# Patient Record
Sex: Male | Born: 1965 | Race: White | Hispanic: No | Marital: Married | Smoking: Former smoker
Health system: Southern US, Community
[De-identification: ages and names within clinical notes are randomized; demographics above are authoritative.]

## PROBLEM LIST (undated history)

## (undated) DIAGNOSIS — E782 Mixed hyperlipidemia: Secondary | ICD-10-CM

## (undated) DIAGNOSIS — I10 Essential (primary) hypertension: Secondary | ICD-10-CM

## (undated) DIAGNOSIS — I2699 Other pulmonary embolism without acute cor pulmonale: Secondary | ICD-10-CM

## (undated) DIAGNOSIS — E119 Type 2 diabetes mellitus without complications: Secondary | ICD-10-CM

## (undated) DIAGNOSIS — G7111 Myotonic muscular dystrophy: Secondary | ICD-10-CM

## (undated) HISTORY — PX: DENTAL SURGERY: SHX609

## (undated) HISTORY — PX: APPENDECTOMY: SHX54

## (undated) HISTORY — PX: OTHER SURGICAL HISTORY: SHX169

## (undated) HISTORY — PX: JOINT REPLACEMENT: SHX530

## (undated) HISTORY — PX: EYE SURGERY: SHX253

---

## 2005-11-22 ENCOUNTER — Emergency Department: Payer: Self-pay | Admitting: Emergency Medicine

## 2007-11-10 ENCOUNTER — Emergency Department: Payer: Self-pay | Admitting: Emergency Medicine

## 2009-09-08 ENCOUNTER — Emergency Department: Payer: Self-pay | Admitting: Emergency Medicine

## 2009-12-03 ENCOUNTER — Emergency Department: Payer: Self-pay | Admitting: Unknown Physician Specialty

## 2011-09-05 ENCOUNTER — Encounter: Payer: Self-pay | Admitting: Family Medicine

## 2011-09-17 ENCOUNTER — Encounter: Payer: Self-pay | Admitting: Family Medicine

## 2011-10-18 ENCOUNTER — Encounter: Payer: Self-pay | Admitting: Family Medicine

## 2012-01-21 ENCOUNTER — Emergency Department: Payer: Self-pay | Admitting: Unknown Physician Specialty

## 2012-01-21 LAB — CBC
HCT: 40.1 % (ref 40.0–52.0)
HGB: 13.1 g/dL (ref 13.0–18.0)
MCH: 29.5 pg (ref 26.0–34.0)
MCHC: 32.7 g/dL (ref 32.0–36.0)
RBC: 4.45 10*6/uL (ref 4.40–5.90)
WBC: 8.4 10*3/uL (ref 3.8–10.6)

## 2012-01-21 LAB — BASIC METABOLIC PANEL
Anion Gap: 8 (ref 7–16)
BUN: 23 mg/dL — ABNORMAL HIGH (ref 7–18)
Chloride: 106 mmol/L (ref 98–107)
Co2: 27 mmol/L (ref 21–32)
Creatinine: 0.89 mg/dL (ref 0.60–1.30)
EGFR (African American): >60
EGFR (Non-African Amer.): >60
Sodium: 141 mmol/L (ref 136–145)

## 2013-04-08 ENCOUNTER — Encounter (INDEPENDENT_AMBULATORY_CARE_PROVIDER_SITE_OTHER): Payer: Self-pay | Admitting: Ophthalmology

## 2013-04-08 DIAGNOSIS — E1139 Type 2 diabetes mellitus with other diabetic ophthalmic complication: Secondary | ICD-10-CM

## 2013-04-08 DIAGNOSIS — H3581 Retinal edema: Secondary | ICD-10-CM

## 2013-04-08 DIAGNOSIS — E11359 Type 2 diabetes mellitus with proliferative diabetic retinopathy without macular edema: Secondary | ICD-10-CM

## 2013-04-08 DIAGNOSIS — H2510 Age-related nuclear cataract, unspecified eye: Secondary | ICD-10-CM

## 2013-04-08 DIAGNOSIS — H43819 Vitreous degeneration, unspecified eye: Secondary | ICD-10-CM

## 2013-04-17 ENCOUNTER — Ambulatory Visit (INDEPENDENT_AMBULATORY_CARE_PROVIDER_SITE_OTHER): Payer: Self-pay | Admitting: Ophthalmology

## 2013-04-17 DIAGNOSIS — E11359 Type 2 diabetes mellitus with proliferative diabetic retinopathy without macular edema: Secondary | ICD-10-CM

## 2013-04-17 DIAGNOSIS — E1165 Type 2 diabetes mellitus with hyperglycemia: Secondary | ICD-10-CM

## 2013-05-06 ENCOUNTER — Other Ambulatory Visit (INDEPENDENT_AMBULATORY_CARE_PROVIDER_SITE_OTHER): Payer: Self-pay | Admitting: Ophthalmology

## 2013-05-06 DIAGNOSIS — E1139 Type 2 diabetes mellitus with other diabetic ophthalmic complication: Secondary | ICD-10-CM

## 2013-05-06 DIAGNOSIS — E11359 Type 2 diabetes mellitus with proliferative diabetic retinopathy without macular edema: Secondary | ICD-10-CM

## 2013-09-30 ENCOUNTER — Ambulatory Visit (INDEPENDENT_AMBULATORY_CARE_PROVIDER_SITE_OTHER): Payer: Self-pay | Admitting: Ophthalmology

## 2014-09-01 ENCOUNTER — Encounter: Payer: Self-pay | Admitting: Psychiatry

## 2014-09-16 ENCOUNTER — Encounter: Payer: Self-pay | Admitting: Psychiatry

## 2014-10-17 ENCOUNTER — Encounter: Payer: Self-pay | Admitting: Psychiatry

## 2014-11-16 ENCOUNTER — Encounter: Payer: Self-pay | Admitting: Psychiatry

## 2015-05-19 ENCOUNTER — Emergency Department: Payer: Medicare Other

## 2015-05-19 ENCOUNTER — Encounter: Payer: Self-pay | Admitting: Emergency Medicine

## 2015-05-19 ENCOUNTER — Emergency Department
Admission: EM | Admit: 2015-05-19 | Discharge: 2015-05-19 | Disposition: A | Discharge status: Home / Self Care | Payer: Medicare Other | Attending: Emergency Medicine | Admitting: Emergency Medicine

## 2015-05-19 DIAGNOSIS — Z87891 Personal history of nicotine dependence: Secondary | ICD-10-CM | POA: Diagnosis not present

## 2015-05-19 DIAGNOSIS — S93402A Sprain of unspecified ligament of left ankle, initial encounter: Secondary | ICD-10-CM | POA: Diagnosis not present

## 2015-05-19 DIAGNOSIS — Y998 Other external cause status: Secondary | ICD-10-CM | POA: Diagnosis not present

## 2015-05-19 DIAGNOSIS — E119 Type 2 diabetes mellitus without complications: Secondary | ICD-10-CM | POA: Diagnosis not present

## 2015-05-19 DIAGNOSIS — Y9389 Activity, other specified: Secondary | ICD-10-CM | POA: Insufficient documentation

## 2015-05-19 DIAGNOSIS — Y9289 Other specified places as the place of occurrence of the external cause: Secondary | ICD-10-CM | POA: Diagnosis not present

## 2015-05-19 DIAGNOSIS — W1839XA Other fall on same level, initial encounter: Secondary | ICD-10-CM | POA: Diagnosis not present

## 2015-05-19 DIAGNOSIS — S99912A Unspecified injury of left ankle, initial encounter: Secondary | ICD-10-CM | POA: Diagnosis present

## 2015-05-19 DIAGNOSIS — M7662 Achilles tendinitis, left leg: Secondary | ICD-10-CM | POA: Diagnosis not present

## 2015-05-19 HISTORY — DX: Type 2 diabetes mellitus without complications: E11.9

## 2015-05-19 HISTORY — DX: Myotonic muscular dystrophy: G71.11

## 2015-05-19 NOTE — ED Notes (Signed)
Pt has been walking more than usual in the past few days, now having left ankle pain-pt thinks possible tendonitis.

## 2015-05-19 NOTE — Discharge Instructions (Signed)
Achilles Tendinitis Achilles tendinitis is inflammation of the tough, cord-like band that attaches the lower muscles of your leg to your heel (Achilles tendon). It is usually caused by overusing the tendon and joint involved.  CAUSES Achilles tendinitis can happen because of:  A sudden increase in exercise or activity (such as running).  Doing the same exercises or activities (such as jumping) over and over.  Not warming up calf muscles before exercising.  Exercising in shoes that are worn out or not made for exercise.  Having arthritis or a bone growth on the back of the heel bone. This can rub against the tendon and hurt the tendon. SIGNS AND SYMPTOMS The most common symptoms are:  Pain in the back of the leg, just above the heel. The pain usually gets worse with exercise and better with rest.  Stiffness or soreness in the back of the leg, especially in the morning.  Swelling of the skin over the Achilles tendon.  Trouble standing on tiptoe. Sometimes, an Achilles tendon tears (ruptures). Symptoms of an Achilles tendon rupture can include:  Sudden, severe pain in the back of the leg.  Trouble putting weight on the foot or walking normally. DIAGNOSIS Achilles tendinitis will be diagnosed based on symptoms and a physical examination. An X-ray may be done to check if another condition is causing your symptoms. An MRI may be ordered if your health care provider suspects you may have completely torn your tendon, which is called an Achilles tendon rupture.  TREATMENT  Achilles tendinitis usually gets better over time. It can take weeks to months to heal completely. Treatment focuses on treating the symptoms and helping the injury heal. HOME CARE INSTRUCTIONS   Rest your Achilles tendon and avoid activities that cause pain.  Apply ice to the injured area:  Put ice in a plastic bag.  Place a towel between your skin and the bag.  Leave the ice on for 20 minutes, 2-3 times a  day  Try to avoid using the tendon (other than gentle range of motion) while the tendon is painful. Do not resume use until instructed by your health care provider. Then begin use gradually. Do not increase use to the point of pain. If pain does develop, decrease use and continue the above measures. Gradually increase activities that do not cause discomfort until you achieve normal use.  Do exercises to make your calf muscles stronger and more flexible. Your health care provider or physical therapist can recommend exercises for you to do.  Wrap your ankle with an elastic bandage or other wrap. This can help keep your tendon from moving too much. Your health care provider will show you how to wrap your ankle correctly.  Only take over-the-counter or prescription medicines for pain, discomfort, or fever as directed by your health care provider. SEEK MEDICAL CARE IF:   Your pain and swelling increase or pain is uncontrolled with medicines.  You develop new, unexplained symptoms or your symptoms get worse.  You are unable to move your toes or foot.  You develop warmth and swelling in your foot.  You have an unexplained temperature. MAKE SURE YOU:   Understand these instructions.  Will watch your condition.  Will get help right away if you are not doing well or get worse. Document Released: 09/12/2005 Document Revised: 09/23/2013 Document Reviewed: 07/15/2013 Exit[employer] Patient Information 2015 [employer]. This information is not intended to replace advice given to you by your health care provider. Make sure you discuss   any questions you have with your health care provider.  

## 2015-05-19 NOTE — ED Notes (Signed)
Pt informed to return if any life threatening symptoms occur.  

## 2015-05-19 NOTE — ED Provider Notes (Signed)
CSN: [id number]     Arrival date & time 05/19/15  1250 History   First MD Initiated Contact with Patient 05/19/15 1321     Chief Complaint  Patient presents with  . 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 Patient reports he was on vacation for the weekend and fell. It is possible he turned his ankle at that time. Now he has pain anytime he bears weight. He has taken over-the-counter medications without relief. He has sprained that ankle before, but has never had surgery to the ankle.  Patient is a 49 y.o. male presenting with ankle pain. The history is provided by the patient.  Ankle Pain Location:  Ankle Time since incident:  4 days Injury: yes   Ankle location:  L ankle Pain details:    Quality:  Aching   Radiates to:  Does not radiate   Severity:  Mild   Onset quality:  Gradual   Duration:  4 days   Timing:  Constant   Progression:  Unchanged Chronicity:  New Dislocation: no   Prior injury to area:  Yes (sprain) Worsened by:  Bearing weight Ineffective treatments:  Acetaminophen and NSAIDs Associated symptoms: stiffness   Associated symptoms: no decreased ROM and no swelling     Past Medical History  Diagnosis Date  . Myotonic dystrophy   . Diabetes mellitus without complication     TYPE ONE   Past Surgical History  Procedure Laterality Date  . Joint replacement    . Eye surgery    . Dental surgery     No family history on file. History  Substance Use Topics  . Smoking status: Former Research scientist (life sciences)  . Smokeless tobacco: Not on file  . Alcohol Use: Yes     Comment: occasionally    Review of Systems  Musculoskeletal: Positive for myalgias, arthralgias and stiffness. Negative for joint swelling and gait problem.  All other systems reviewed and are negative.     Allergies  Review of patient's allergies indicates not on file.  Home Medications   Prior to  Admission medications   Not on File   BP 130/98 mmHg  Pulse 79  Temp(Src) 97.8 F (36.6 C) (Oral)  Resp 18  SpO2 94% Physical Exam  Constitutional: He is oriented to person, place, and time. He appears well-developed and well-nourished.  HENT:  Head: Normocephalic and atraumatic.  Neck: Normal range of motion.  Musculoskeletal: Normal range of motion. He exhibits tenderness.       Left ankle: He exhibits normal range of motion, no swelling, no deformity and normal pulse. Tenderness. Lateral malleolus and medial malleolus tenderness found. No head of 5th metatarsal and no proximal fibula tenderness found. Achilles tendon exhibits pain. Achilles tendon exhibits normal Thompson's test results.  Neurological: He is alert and oriented to person, place, and time. He exhibits normal muscle tone.  Skin: Skin is warm and dry.  Psychiatric: He has a normal mood and affect. His behavior is normal. Judgment and thought content normal.  Nursing note and vitals reviewed.   ED Course  Procedures (including critical care time) Labs Review Labs Reviewed - No data to display  Imaging Review Dg Ankle Complete Left  05/19/2015   CLINICAL DATA:  Fall wall walking with twisting injury, lateral ankle pain, initial encounter  EXAM: LEFT ANKLE COMPLETE - 3+ VIEW  COMPARISON:  None.  FINDINGS: There  is no evidence of fracture, dislocation, or joint effusion. There is no evidence of arthropathy or other focal bone abnormality. Soft tissues are unremarkable.  IMPRESSION: No acute abnormality noted.   Electronically Signed   By: Inez Catalina M.D.   On: 05/19/2015 14:58   Dg Foot Complete Left  05/19/2015   CLINICAL DATA:  Status post fall 05/16/2015. Right foot pain. Initial encounter.  EXAM: LEFT FOOT - COMPLETE 3+ VIEW  COMPARISON:  None.  FINDINGS: No acute bony or joint abnormality is identified. Accessory ossicle of the navicular is noted. No notable arthropathy is seen. Soft tissues are unremarkable.   IMPRESSION: Negative exam.   Electronically Signed   By: Inge Rise M.D.   On: 05/19/2015 15:00     EKG Interpretation None      MDM  Negative pain films, likely tendonitis given increase in activity no vacation. Recommended OTC NSAIDs, follow up with ortho for persistent symptoms  Final diagnoses:  Achilles tendinitis of left lower extremity        Corliss Parish, PA-C 05/19/15 1506  Harvest Dark, MD 05/19/15 1538

## 2015-07-17 ENCOUNTER — Telehealth: Payer: Self-pay | Admitting: Emergency Medicine

## 2015-07-17 ENCOUNTER — Encounter: Payer: Self-pay | Admitting: Emergency Medicine

## 2015-07-17 ENCOUNTER — Emergency Department: Payer: Medicare Other

## 2015-07-17 ENCOUNTER — Inpatient Hospital Stay
Admission: EM | Admit: 2015-07-17 | Discharge: 2015-07-25 | DRG: 175 | Disposition: A | Discharge status: Other Acute Inpatient Hospital | Payer: Medicare Other | Attending: Internal Medicine | Admitting: Internal Medicine

## 2015-07-17 ENCOUNTER — Other Ambulatory Visit: Payer: Self-pay

## 2015-07-17 DIAGNOSIS — Z7982 Long term (current) use of aspirin: Secondary | ICD-10-CM | POA: Diagnosis not present

## 2015-07-17 DIAGNOSIS — I82432 Acute embolism and thrombosis of left popliteal vein: Secondary | ICD-10-CM | POA: Diagnosis present

## 2015-07-17 DIAGNOSIS — I2699 Other pulmonary embolism without acute cor pulmonale: Principal | ICD-10-CM | POA: Diagnosis present

## 2015-07-17 DIAGNOSIS — I272 Other secondary pulmonary hypertension: Secondary | ICD-10-CM | POA: Diagnosis present

## 2015-07-17 DIAGNOSIS — I10 Essential (primary) hypertension: Secondary | ICD-10-CM | POA: Diagnosis present

## 2015-07-17 DIAGNOSIS — J9600 Acute respiratory failure, unspecified whether with hypoxia or hypercapnia: Secondary | ICD-10-CM | POA: Diagnosis not present

## 2015-07-17 DIAGNOSIS — Z79899 Other long term (current) drug therapy: Secondary | ICD-10-CM | POA: Diagnosis not present

## 2015-07-17 DIAGNOSIS — J80 Acute respiratory distress syndrome: Secondary | ICD-10-CM | POA: Diagnosis present

## 2015-07-17 DIAGNOSIS — J690 Pneumonitis due to inhalation of food and vomit: Secondary | ICD-10-CM | POA: Diagnosis not present

## 2015-07-17 DIAGNOSIS — E872 Acidosis: Secondary | ICD-10-CM | POA: Diagnosis present

## 2015-07-17 DIAGNOSIS — Z87891 Personal history of nicotine dependence: Secondary | ICD-10-CM

## 2015-07-17 DIAGNOSIS — J9601 Acute respiratory failure with hypoxia: Secondary | ICD-10-CM | POA: Diagnosis not present

## 2015-07-17 DIAGNOSIS — G7111 Myotonic muscular dystrophy: Secondary | ICD-10-CM | POA: Diagnosis not present

## 2015-07-17 DIAGNOSIS — E119 Type 2 diabetes mellitus without complications: Secondary | ICD-10-CM | POA: Diagnosis not present

## 2015-07-17 DIAGNOSIS — I82412 Acute embolism and thrombosis of left femoral vein: Secondary | ICD-10-CM | POA: Diagnosis present

## 2015-07-17 DIAGNOSIS — I248 Other forms of acute ischemic heart disease: Secondary | ICD-10-CM | POA: Diagnosis present

## 2015-07-17 DIAGNOSIS — I82442 Acute embolism and thrombosis of left tibial vein: Secondary | ICD-10-CM | POA: Diagnosis present

## 2015-07-17 DIAGNOSIS — R0602 Shortness of breath: Secondary | ICD-10-CM | POA: Diagnosis not present

## 2015-07-17 DIAGNOSIS — E785 Hyperlipidemia, unspecified: Secondary | ICD-10-CM | POA: Diagnosis present

## 2015-07-17 DIAGNOSIS — E875 Hyperkalemia: Secondary | ICD-10-CM | POA: Diagnosis present

## 2015-07-17 DIAGNOSIS — R9389 Abnormal findings on diagnostic imaging of other specified body structures: Secondary | ICD-10-CM

## 2015-07-17 DIAGNOSIS — E1165 Type 2 diabetes mellitus with hyperglycemia: Secondary | ICD-10-CM | POA: Diagnosis present

## 2015-07-17 DIAGNOSIS — I4891 Unspecified atrial fibrillation: Secondary | ICD-10-CM | POA: Diagnosis not present

## 2015-07-17 DIAGNOSIS — R0600 Dyspnea, unspecified: Secondary | ICD-10-CM

## 2015-07-17 DIAGNOSIS — I82403 Acute embolism and thrombosis of unspecified deep veins of lower extremity, bilateral: Secondary | ICD-10-CM

## 2015-07-17 DIAGNOSIS — Z794 Long term (current) use of insulin: Secondary | ICD-10-CM | POA: Diagnosis not present

## 2015-07-17 LAB — CBC
HEMATOCRIT: 41.6 % (ref 40.0–52.0)
Hemoglobin: 13 g/dL (ref 13.0–18.0)
MCH: 27.7 pg (ref 26.0–34.0)
MCHC: 31.3 g/dL — AB (ref 32.0–36.0)
MCV: 88.4 fL (ref 80.0–100.0)
PLATELETS: 335 10*3/uL (ref 150–440)
RBC: 4.71 MIL/uL (ref 4.40–5.90)
RDW: 15.7 % — AB (ref 11.5–14.5)
WBC: 8.9 10*3/uL (ref 3.8–10.6)

## 2015-07-17 LAB — FIBRIN DERIVATIVES D-DIMER (ARMC ONLY): Fibrin derivatives D-dimer (ARMC): 7689.92 — ABNORMAL HIGH (ref 0–499)

## 2015-07-17 LAB — COMPREHENSIVE METABOLIC PANEL
ALT: 72 U/L — ABNORMAL HIGH (ref 17–63)
AST: 108 U/L — ABNORMAL HIGH (ref 15–41)
Albumin: 3.5 g/dL (ref 3.5–5.0)
Alkaline Phosphatase: 43 U/L (ref 38–126)
Anion gap: 11 (ref 5–15)
BILIRUBIN TOTAL: 1 mg/dL (ref 0.3–1.2)
BUN: 23 mg/dL — ABNORMAL HIGH (ref 6–20)
CO2: 21 mmol/L — ABNORMAL LOW (ref 22–32)
Calcium: 9.5 mg/dL (ref 8.9–10.3)
Chloride: 105 mmol/L (ref 101–111)
Creatinine, Ser: 1.32 mg/dL — ABNORMAL HIGH (ref 0.61–1.24)
Glucose, Bld: 383 mg/dL — ABNORMAL HIGH (ref 65–99)
Potassium: 5.2 mmol/L — ABNORMAL HIGH (ref 3.5–5.1)
Sodium: 137 mmol/L (ref 135–145)
Total Protein: 6.7 g/dL (ref 6.5–8.1)

## 2015-07-17 LAB — GLUCOSE, CAPILLARY
GLUCOSE-CAPILLARY: 378 mg/dL — AB (ref 65–99)
GLUCOSE-CAPILLARY: 167 mg/dL — AB (ref 65–99)

## 2015-07-17 LAB — TROPONIN I
Troponin I: 0.11 ng/mL — ABNORMAL HIGH (ref <0.031)
Troponin I: 1.7 ng/mL — ABNORMAL HIGH (ref <0.031)

## 2015-07-17 LAB — URINALYSIS COMPLETE WITH MICROSCOPIC (ARMC ONLY)
Bacteria, UA: NONE SEEN
Bilirubin Urine: NEGATIVE
Glucose, UA: 50 mg/dL — AB
HGB URINE DIPSTICK: NEGATIVE
Ketones, ur: NEGATIVE mg/dL
Leukocytes, UA: NEGATIVE
NITRITE: NEGATIVE
PH: 5 (ref 5.0–8.0)
PROTEIN: NEGATIVE mg/dL
SQUAMOUS EPITHELIAL / LPF: NONE SEEN
Specific Gravity, Urine: >1.06 — ABNORMAL HIGH (ref 1.005–1.030)

## 2015-07-17 LAB — LACTIC ACID, PLASMA: Lactic Acid, Venous: 3.1 mmol/L (ref 0.5–2.0)

## 2015-07-17 LAB — MRSA PCR SCREENING: MRSA by PCR: NEGATIVE

## 2015-07-17 MED ORDER — PANTOPRAZOLE SODIUM 40 MG PO TBEC
40.0000 mg | DELAYED_RELEASE_TABLET | Freq: Every day | ORAL | Status: DC
  Administered 2015-07-17 – 2015-07-18 (×2): 40 mg via ORAL
  Filled 2015-07-17 (×2): qty 1

## 2015-07-17 MED ORDER — OXYCODONE HCL 5 MG PO TABS
5.0000 mg | ORAL_TABLET | ORAL | Status: DC | PRN
  Administered 2015-07-17 – 2015-07-19 (×6): 5 mg via ORAL
  Filled 2015-07-17 (×6): qty 1

## 2015-07-17 MED ORDER — INSULIN ASPART 100 UNIT/ML ~~LOC~~ SOLN
12.5000 [IU] | Freq: Two times a day (BID) | SUBCUTANEOUS | Status: DC
  Administered 2015-07-17 – 2015-07-20 (×6): 13 [IU] via SUBCUTANEOUS
  Filled 2015-07-17 (×6): qty 13

## 2015-07-17 MED ORDER — SODIUM CHLORIDE 0.9 % IJ SOLN
3.0000 mL | Freq: Two times a day (BID) | INTRAMUSCULAR | Status: DC
  Administered 2015-07-18 – 2015-07-19 (×2): 3 mL via INTRAVENOUS

## 2015-07-17 MED ORDER — AZELASTINE HCL 0.1 % NA SOLN
2.0000 | Freq: Two times a day (BID) | NASAL | Status: DC | PRN
  Filled 2015-07-17: qty 30

## 2015-07-17 MED ORDER — DOCUSATE SODIUM 100 MG PO CAPS
100.0000 mg | ORAL_CAPSULE | Freq: Two times a day (BID) | ORAL | Status: DC
  Administered 2015-07-17 – 2015-07-25 (×13): 100 mg via ORAL
  Filled 2015-07-17 (×14): qty 1

## 2015-07-17 MED ORDER — SODIUM CHLORIDE 0.9 % IV SOLN
1000.0000 mL | Freq: Once | INTRAVENOUS | Status: AC
  Administered 2015-07-17: 1000 mL via INTRAVENOUS

## 2015-07-17 MED ORDER — CETYLPYRIDINIUM CHLORIDE 0.05 % MT LIQD
7.0000 mL | Freq: Two times a day (BID) | OROMUCOSAL | Status: DC
  Administered 2015-07-17 – 2015-07-19 (×3): 7 mL via OROMUCOSAL

## 2015-07-17 MED ORDER — HEPARIN SODIUM (PORCINE) 5000 UNIT/ML IJ SOLN
4000.0000 [IU] | Freq: Once | INTRAMUSCULAR | Status: AC
  Administered 2015-07-17: 4000 [IU] via INTRAVENOUS

## 2015-07-17 MED ORDER — IOHEXOL 350 MG/ML SOLN
100.0000 mL | Freq: Once | INTRAVENOUS | Status: AC | PRN
  Administered 2015-07-17: 75 mL via INTRAVENOUS

## 2015-07-17 MED ORDER — PNEUMOCOCCAL VAC POLYVALENT 25 MCG/0.5ML IJ INJ
0.5000 mL | INJECTION | INTRAMUSCULAR | Status: AC
  Administered 2015-07-18: 0.5 mL via INTRAMUSCULAR
  Filled 2015-07-17: qty 0.5

## 2015-07-17 MED ORDER — ASPIRIN 81 MG PO CHEW
324.0000 mg | CHEWABLE_TABLET | Freq: Once | ORAL | Status: AC
  Administered 2015-07-17: 324 mg via ORAL
  Filled 2015-07-17: qty 4

## 2015-07-17 MED ORDER — PANTOPRAZOLE SODIUM 40 MG PO TBEC: 40.0000 mg | DELAYED_RELEASE_TABLET | Freq: Every day | ORAL | Status: DC

## 2015-07-17 MED ORDER — ASPIRIN 325 MG PO TABS
162.5000 mg | ORAL_TABLET | Freq: Every day | ORAL | Status: DC
  Administered 2015-07-17 – 2015-07-24 (×7): 162.5 mg via ORAL
  Filled 2015-07-17 (×10): qty 1

## 2015-07-17 MED ORDER — ACETAMINOPHEN 650 MG RE SUPP
650.0000 mg | Freq: Four times a day (QID) | RECTAL | Status: DC | PRN
  Administered 2015-07-21: 650 mg via RECTAL
  Filled 2015-07-17: qty 1

## 2015-07-17 MED ORDER — ONDANSETRON HCL 4 MG/2ML IJ SOLN
4.0000 mg | Freq: Four times a day (QID) | INTRAMUSCULAR | Status: DC | PRN
  Administered 2015-07-18: 4 mg via INTRAVENOUS
  Filled 2015-07-17: qty 2

## 2015-07-17 MED ORDER — SODIUM CHLORIDE 0.9 % IV SOLN
INTRAVENOUS | Status: AC
  Administered 2015-07-17 – 2015-07-18 (×2): via INTRAVENOUS

## 2015-07-17 MED ORDER — FENOFIBRATE 160 MG PO TABS
160.0000 mg | ORAL_TABLET | Freq: Every day | ORAL | Status: DC
  Administered 2015-07-17 – 2015-07-25 (×7): 160 mg via ORAL
  Filled 2015-07-17 (×7): qty 1

## 2015-07-17 MED ORDER — PRAVASTATIN SODIUM 20 MG PO TABS
40.0000 mg | ORAL_TABLET | Freq: Every day | ORAL | Status: DC
  Administered 2015-07-17 – 2015-07-24 (×7): 40 mg via ORAL
  Filled 2015-07-17 (×8): qty 2

## 2015-07-17 MED ORDER — ONDANSETRON HCL 4 MG PO TABS
4.0000 mg | ORAL_TABLET | Freq: Four times a day (QID) | ORAL | Status: DC | PRN
  Filled 2015-07-17: qty 1

## 2015-07-17 MED ORDER — INSULIN ASPART 100 UNIT/ML ~~LOC~~ SOLN
0.0000 [IU] | Freq: Three times a day (TID) | SUBCUTANEOUS | Status: DC
  Administered 2015-07-18 (×3): 2 [IU] via SUBCUTANEOUS
  Administered 2015-07-19 – 2015-07-20 (×3): 3 [IU] via SUBCUTANEOUS
  Administered 2015-07-20 – 2015-07-21 (×2): 5 [IU] via SUBCUTANEOUS
  Filled 2015-07-17: qty 2
  Filled 2015-07-17: qty 3
  Filled 2015-07-17: qty 2
  Filled 2015-07-17: qty 3
  Filled 2015-07-17: qty 5
  Filled 2015-07-17: qty 2
  Filled 2015-07-17: qty 5

## 2015-07-17 MED ORDER — HEPARIN (PORCINE) IN NACL 100-0.45 UNIT/ML-% IJ SOLN
1300.0000 [IU]/h | INTRAMUSCULAR | Status: DC
  Administered 2015-07-17: 1300 [IU]/h via INTRAVENOUS
  Filled 2015-07-17 (×4): qty 250

## 2015-07-17 MED ORDER — ACETAMINOPHEN 325 MG PO TABS
650.0000 mg | ORAL_TABLET | Freq: Four times a day (QID) | ORAL | Status: DC | PRN
  Administered 2015-07-17 – 2015-07-19 (×4): 650 mg via ORAL
  Filled 2015-07-17 (×4): qty 2

## 2015-07-17 MED ORDER — SODIUM POLYSTYRENE SULFONATE 15 GM/60ML PO SUSP
15.0000 g | Freq: Once | ORAL | Status: AC
  Administered 2015-07-17: 15 g via ORAL
  Filled 2015-07-17: qty 60

## 2015-07-17 NOTE — ED Notes (Signed)
Elevated blood sugar and low b/p  This am  Positive n/v/d

## 2015-07-17 NOTE — Progress Notes (Signed)
ANTICOAGULATION CONSULT NOTE - Initial Consult  Pharmacy Consult for Heparin  Indication: pulmonary embolus  No Known Allergies  Patient Measurements: Height: 5\' 5"  (165.1 cm) Weight: 175 lb (79.379 kg) IBW/kg (Calculated) : 61.5 Heparin Dosing Weight:  77.6 kg   Vital Signs: Temp: 97 F (36.1 C) (07/31 1222) Temp Source: Oral (07/31 1222) BP: 133/96 mmHg (07/31 1830) Pulse Rate: 99 (07/31 1830)  Labs:  Recent Labs  07/17/15 1239  HGB 13.0  HCT 41.6  PLT 335  CREATININE 1.32*  TROPONINI 0.11*    Estimated Creatinine Clearance: 66.5 mL/min (by C-G formula based on Cr of 1.32).   Medical History: Past Medical History  Diagnosis Date  . Myotonic dystrophy   . Diabetes mellitus without complication     TYPE ONE    Medications:   (Not in a hospital admission)  Assessment: Bilateral PE  Goal of Therapy:  Heparin level 0.3-0.7 units/ml Monitor platelets by anticoagulation protocol: Yes   Plan:  Give 4000 units bolus x 1 Start heparin infusion at 1300 units/hr  Will draw 1st HL 6 hrs after start of drip on 8/1 @ 00:00   Yena Tisby D 07/17/2015,7:00 PM

## 2015-07-17 NOTE — ED Notes (Signed)
Brought in via family with elevated blood sugars.Bradley Bradley

## 2015-07-17 NOTE — ED Provider Notes (Signed)
IMPRESSION: 1. Bilateral pulmonary emboli. Positive for acute PE with CT evidence of right heart strain (RV/LV Ratio = 1.65) consistent with at least submassive (intermediate risk) PE. The presence of right heart strain has been associated with an increased risk of morbidity and mortality. Please activate Code PE by paging [phone]. 2. Probable small pulmonary infarct in the peripheral RIGHT upper lobe. 3. Low volumes with basilar atelectasis. 4. 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 5. Critical Value/emergent results were called by telephone at the time of interpretation on 07/17/2015 at 5:19 pm to charge nurse Romie Minus, who verbally acknowledged these results.   Patient will be admitted, Heparin started.  Earleen Newport, MD 07/17/15 [phone]

## 2015-07-17 NOTE — ED Provider Notes (Signed)
[facility] Emergency Department Provider Note  ____________________________________________  Time seen: On arrival  I have reviewed the triage vital signs and the nursing notes.   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 He reports he is feeling well yesterday but this morning he feels diffusely weak. Typically is able to keep his sugars under good control. Complains of mild nausea, but no chest pain no abdominal pain. He denies fevers.     Past Medical History  Diagnosis Date  . Myotonic dystrophy   . Diabetes mellitus without complication     TYPE ONE    There are no active problems to display for this patient.   Past Surgical History  Procedure Laterality Date  . Joint replacement    . Eye surgery    . Dental surgery      No current outpatient prescriptions on file.  Allergies Review of patient's allergies indicates no known allergies.  No family history on file.  Social History History  Substance Use Topics  . Smoking status: Former Research scientist (life sciences)  . Smokeless tobacco: Not on file  . Alcohol Use: Yes     Comment: occasionally    Review of Systems  Constitutional: Negative for fever. Eyes: Negative for visual changes. ENT: Negative for sore throat Cardiovascular: Negative for chest pain. Respiratory: Negative for shortness of breath. Gastrointestinal: Negative for abdominal pain, vomiting and diarrhea. Genitourinary: Negative for dysuria. Musculoskeletal: Negative for back pain. Skin: Negative for rash. Neurological: Diffuse weakness Psychiatric: No anxiety    ____________________________________________   PHYSICAL EXAM:  VITAL SIGNS: ED Triage Vitals  Enc Vitals Group     BP 07/17/15 1222 90/52 mmHg     Pulse Rate 07/17/15 1222 106     Resp 07/17/15 1222 18     Temp  07/17/15 1222 97 F (36.1 C)     Temp Source 07/17/15 1222 Oral     SpO2 07/17/15 1222 98 %     Weight 07/17/15 1222 175 lb (79.379 kg)     Height 07/17/15 1222 5\' 5"  (1.651 m)     Head Cir --      Peak Flow --      Pain Score --      Pain Loc --      Pain Edu? --      Excl. in [place]? --      Constitutional: Alert and oriented. Ill-appearing Eyes: Conjunctivae are normal.  ENT   Head: Normocephalic and atraumatic.   Mouth/Throat: Mucous membranes are moist. Cardiovascular: Tachycardic, regular rhythm. Normal and symmetric distal pulses are present in all extremities. No murmurs, rubs, or gallops. Respiratory: Positive tachypnea. Breath sounds are clear and equal bilaterally.  Gastrointestinal: Soft and non-tender in all quadrants. No distention. There is no CVA tenderness. Genitourinary: deferred Musculoskeletal: Nontender with normal range of motion in all extremities. No lower extremity tenderness nor edema. Neurologic:  Normal speech and language. No gross focal neurologic deficits are appreciated. Skin:  Skin is warm, dry and intact. No rash noted. Psychiatric: Mood and affect are normal. Patient exhibits appropriate insight and judgment.  ____________________________________________    LABS (pertinent positives/negatives)  Labs Reviewed  BASIC METABOLIC PANEL  CBC  URINALYSIS COMPLETEWITH MICROSCOPIC ([place])  CBG MONITORING, ED    ____________________________________________   EKG  ED ECG REPORT I, Lavonia Drafts, the attending physician, personally viewed and  interpreted this ECG.   Date: 07/17/2015  EKG Time: 12:52 PM  Rate: 114  Rhythm: normal EKG, normal sinus rhythm, unchanged from previous tracings, sinus tachycardia  Axis: Normal  Intervals:left anterior fascicular block  ST&T Change: Nonspecific changes   ____________________________________________    RADIOLOGY I have personally reviewed any xrays that were ordered on this  patient: Chest x-ray unremarkable  ____________________________________________   PROCEDURES  Procedure(s) performed: none  Critical Care performed: yes CRITICAL CARE Performed by: Lavonia Drafts   Total critical care time: 30 min  Critical care time was exclusive of separately billable procedures and treating other patients.  Critical care was necessary to treat or prevent imminent or life-threatening deterioration.  Critical care was time spent personally by me on the following activities: development of treatment plan with patient and/or surrogate as well as nursing, discussions with consultants, evaluation of patient's response to treatment, examination of patient, obtaining history from patient or surrogate, ordering and performing treatments and interventions, ordering and review of laboratory studies, ordering and review of radiographic studies, pulse oximetry and re-evaluation of patient's condition.   ____________________________________________   INITIAL IMPRESSION / ASSESSMENT AND PLAN / ED COURSE  Pertinent labs & imaging results that were available during my care of the patient were reviewed by me and considered in my medical decision making (see chart for details).  Patient with history of myotonic dystrophy and diabetes. Today his glucose is elevated he feels mildly short of breath. Denies fevers chills but does complain of weakness diffusely. Differential diagnosis is extensive but includes worsening of myotonic dystrophy, pneumonia, ACS, PE, arrhythmia, hyperglycemia.  We will give normal saline IV, obtain blood work chest x-ray and reevaluate   ----------------------------------------- 2:02 PM on 07/17/2015 -----------------------------------------  Patient with elevated troponin. Chest x-ray unremarkable, lactic acid elevated. d-dimer pending ____________________________________________  CT angio pending for markedly elevated D-dimer.  FINAL CLINICAL  IMPRESSION(S) / ED DIAGNOSES  Pulmonary embolus   Lavonia Drafts, MD 07/17/15 1540

## 2015-07-17 NOTE — H&P (Signed)
[facility] NAME: Bradley Bradley    MR#:  [id number]  DATE OF BIRTH:  09/09/1966  DATE OF ADMISSION:  07/17/2015  PRIMARY CARE PHYSICIAN: Dion Body, MD   REQUESTING/REFERRING PHYSICIAN: dr.Kinner  CHIEF COMPLAINT:   Weakness and shortness of breath HISTORY OF PRESENT ILLNESS:  Bradley Bradley  is a 49 y.o. 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 Patient was also feeling weak, dizzy and nauseous. Came into the ED CT and a gram of the chest has revealed bilateral submassive pulmonary embolism and elevated troponin at 0.11. Patient was heparinized with heparin bolus and heparin drip was initiated in the ED. ED physician has discussed with on-call vascular surgeon Dr. Marzetta Board who has recommended medical management with heparin but no thrombectomy at this time. Patient denies any injuries, long periods of immobility or history of cancer. Reporting slight discomfort in his chest but denies any chest pain  PAST MEDICAL HISTORY:   Past Medical History  Diagnosis Date  . Myotonic dystrophy   . Diabetes mellitus without complication     TYPE ONE    PAST SURGICAL HISTOIRY:   Past Surgical History  Procedure Laterality Date  . Joint replacement    . Eye surgery    . Dental surgery      SOCIAL HISTORY:   History  Substance Use Topics  . Smoking status: Former Research scientist (life sciences)  . Smokeless tobacco: Not on file  . Alcohol Use: Yes     Comment: occasionally    FAMILY HISTORY:  No family history on file.  DRUG ALLERGIES:  No Known Allergies  REVIEW OF SYSTEMS:  CONSTITUTIONAL: No fever, fatigue . Reporting weakness.  EYES: No blurred or double vision.  EARS, NOSE, AND THROAT: No tinnitus or ear pain.  RESPIRATORY: No cough,  reporting shortness of breath, denies wheezing or hemoptysis.  CARDIOVASCULAR: No chest pain, orthopnea, edema.  GASTROINTESTINAL: No nausea, vomiting, diarrhea or abdominal pain.  GENITOURINARY: No dysuria, hematuria.  ENDOCRINE: No polyuria, nocturia,  HEMATOLOGY: No anemia, easy bruising or bleeding SKIN: No rash or lesion. MUSCULOSKELETAL: No joint pain or arthritis.   NEUROLOGIC: No tingling, numbness, weakness.  PSYCHIATRY: No anxiety or depression.   MEDICATIONS AT HOME:   Prior to Admission medications   Medication Sig Start Date End Date Taking? Authorizing Provider  amoxicillin-clavulanate (AUGMENTIN) 875-125 MG per tablet Take 1 tablet by mouth 2 (two) times daily. 07/07/15  Yes Historical Provider, MD  aspirin 325 MG tablet Take 162.5 mg by mouth at bedtime.   Yes Historical Provider, MD  azelastine (ASTELIN) 0.1 % nasal spray Place 2 sprays into both nostrils 2 (two) times daily as needed for rhinitis. Use in each nostril as directed   Yes Historical Provider, MD  b complex vitamins tablet Take 1 tablet by mouth at bedtime.   Yes Historical Provider, MD  Cinnamon 500 MG capsule Take 500 mg by mouth every other day.    Yes Historical Provider, MD  Coenzyme Q10 (CO Q 10 PO) Take 1 tablet by mouth at bedtime.   Yes Historical Provider, MD  fenofibrate (TRICOR) 145 MG tablet Take 145 mg by mouth at bedtime.   Yes Historical Provider, MD  Ginger, Zingiber officinalis, 500 MG CAPS Take 1 capsule by mouth every other day.  Yes Historical Provider, MD  glucosamine-chondroitin 500-400 MG tablet Take 0.5 tablets by mouth at bedtime.   Yes Historical Provider, MD  insulin aspart (NOVOLOG) 100 UNIT/ML injection Inject 12.5 Units into the skin 2 (two) times daily.   Yes Historical Provider, MD  Insulin Glulisine (APIDRA SOLOSTAR) 100 UNIT/ML Solostar Pen Inject 10-25 Units into the skin 2 (two) times daily. Taken per sliding scale   Yes Historical Provider, MD  lansoprazole (PREVACID) 15 MG  capsule Take 15 mg by mouth daily at 12 noon.   Yes Historical Provider, MD  lisinopril-hydrochlorothiazide (PRINZIDE,ZESTORETIC) 10-12.5 MG per tablet Take 0.5 tablets by mouth at bedtime.   Yes Historical Provider, MD  metFORMIN (GLUCOPHAGE) 500 MG tablet Take 1,000 mg by mouth 2 (two) times daily with a meal.   Yes Historical Provider, MD  Multiple Vitamin (MULTIVITAMIN) tablet Take 1 tablet by mouth at bedtime.   Yes Historical Provider, MD  pravastatin (PRAVACHOL) 40 MG tablet Take 40 mg by mouth at bedtime.   Yes Historical Provider, MD      VITAL SIGNS:  Blood pressure 121/93, pulse 103, temperature 97 F (36.1 C), temperature source Oral, resp. rate 23, height 5\' 5"  (1.651 m), weight 79.379 kg (175 lb), SpO2 94 %.  PHYSICAL EXAMINATION:  GENERAL:  49 y.o.-year-old patient lying in the bed with no acute distress.  EYES: Pupils equal, round, reactive to light and accommodation. No scleral icterus. Extraocular muscles intact.  HEENT: Head atraumatic, normocephalic. Oropharynx and nasopharynx clear.  NECK:  Supple, no jugular venous distention. No thyroid enlargement, no tenderness.  LUNGS: Normal breath sounds bilaterally, no wheezing, rales,rhonchi or crepitation. No use of accessory muscles of respiration.  CARDIOVASCULAR: S1, S2 normal. No murmurs, rubs, or gallops.  ABDOMEN: Soft, nontender, nondistended. Bowel sounds present. No organomegaly or mass.  EXTREMITIES: No pedal edema, cyanosis, or clubbing.  NEUROLOGIC: Cranial nerves II through XII are intact. Muscle strength 4/5 in all extremities. Sensation intact. Gait not checked.  PSYCHIATRIC: The patient is alert and oriented x 3.  SKIN: No obvious rash, lesion, or ulcer.   LABORATORY PANEL:   CBC  Recent Labs Lab 07/17/15 1239  WBC 8.9  HGB 13.0  HCT 41.6  PLT 335   ------------------------------------------------------------------------------------------------------------------  Chemistries   Recent Labs Lab  07/17/15 1239  NA 137  K 5.2*  CL 105  CO2 21*  GLUCOSE 383*  BUN 23*  CREATININE 1.32*  CALCIUM 9.5  AST 108*  ALT 72*  ALKPHOS 43  BILITOT 1.0   ------------------------------------------------------------------------------------------------------------------  Cardiac Enzymes  Recent Labs Lab 07/17/15 1239  TROPONINI 0.11*   ------------------------------------------------------------------------------------------------------------------  RADIOLOGY:  Ct Angio Chest Pe W/cm &/or Wo Cm  07/17/2015   CLINICAL DATA:  Myotonic dystrophy. Weakness. Dizziness. Nausea and hyperglycemia. Initial encounter. Pulmonary embolism.  EXAM: CT ANGIOGRAPHY CHEST WITH CONTRAST  TECHNIQUE: Multidetector CT imaging of the chest was performed using the standard protocol during bolus administration of intravenous contrast. Multiplanar CT image reconstructions and MIPs were obtained to evaluate the vascular anatomy.  CONTRAST:  60mL OMNIPAQUE IOHEXOL 350 MG/ML SOLN  COMPARISON:  None.  FINDINGS: Bones: No aggressive osseous lesions. dextroconvex thoracic scoliosis. Scattered Schmorl's nodes throughout the thoracic spine. 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 paraspinal muscular atrophy, compatible with myotonic dystrophy.  Cardiovascular: Large bilateral pulmonary emboli are present, straddling the bifurcation of the LEFT and RIGHT pulmonary arteries. Embolus is present in all lobes of both lungs. There is also RIGHT heart strain. The LEFT ventricle measures 26 mm and the  RIGHT ventricle measures 43 mm.  Lungs: Basilar atelectasis. Small focus of ground-glass attenuation in the anterior RIGHT upper lobe potentially represents pulmonary infarction.  Central airways: Patent.  Effusions: None.  Lymphadenopathy: No axillary adenopathy. No mediastinal or hilar adenopathy.  Esophagus: Normal.  Upper abdomen: Either fat containing subxiphoid hernia or a laxity of the anterior abdominal wall  in the subxiphoid region. This area is only partially visualized. No gross acute abnormality in the upper abdomen.  Other: None.  Review of the MIP images confirms the above findings.  IMPRESSION: 1. Bilateral pulmonary emboli. Positive for acute PE with CT evidence of right heart strain (RV/LV Ratio = 1.65) consistent with at least submassive (intermediate risk) PE. The presence of right heart strain has been associated with an increased risk of morbidity and mortality. Please activate Code PE by paging [phone]. 2. Probable small pulmonary infarct in the peripheral RIGHT upper lobe. 3. Low volumes with basilar atelectasis. 4. 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 5. Critical Value/emergent results were called by telephone at the time of interpretation on 07/17/2015 at 5:19 pm to charge nurse Romie Minus, who verbally acknowledged these results.   Electronically Signed   By: Dereck Ligas M.D.   On: 07/17/2015 17:18   Dg Chest Portable 1 View  07/17/2015   CLINICAL DATA:  Weakness, nonsmoker.  EXAM: PORTABLE CHEST - 1 VIEW  COMPARISON:  None.  FINDINGS: Cardiac silhouette is enlarged. There are low lung volumes. There is mild linear atelectasis at the lung bases. No pulmonary edema or infiltrate.  IMPRESSION: Low lung volumes and bibasilar atelectasis.   Electronically Signed   By: Suzy Bouchard M.D.   On: 07/17/2015 13:30    EKG:  No orders found for this or any previous visit.  IMPRESSION AND PLAN:   Bradley Bradley  is a 49 y.o. 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 Patient was also feeling weak, dizzy and nauseous. Came into the ED CT and a gram of the chest has revealed bilateral submassive pulmonary embolism and elevated troponin at 0.11. Patient was  heparinized with heparin bolus and heparin drip was initiated in the ED. ED physician has discussed with on-call vascular surgeon Dr. Lucky Cowboy who has recommended medical management with heparin but no thrombectomy at this time.  1. Acute respiratory distress secondary to bilateral submassive pulmonary embolism with right heart strain -etiology unclear  Heparin bolus was given in the ED will continue heparin drip On-call vascular surgeon Dr. Lucky Cowboy is not recommending thrombectomy at this time Check a.m. Labs Bilateral lower extremity venous Dopplers to rule out DVT Oncology consult is placed for coagulation workup Pulmonology consult is placed  2. Elevated troponin with right heart strain This is probably from  #1 Cycle cardiac biomarkers Provide aspirin and follow-up with cardiology  3. 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 Hyperkalemia  No acute T-wave changes Kayexalate 1  GI prophylaxis with Protonix     Condition critical High mortality risk  All the records are reviewed and case discussed with ED provider. Management plans discussed with the patient, family and they are in agreement.  CODE STATUS: Full code, wife is the healthcare power of attorney  [place]  TAKING CARE OF THIS PATIENT: Reviewing medical records, history and physical, admission orders and coordination of care -50 minutes.    Nicholes Mango M.D on 07/17/2015 at 6:14 PM  Between 7am to 6pm - Pager - [phone]  After 6pm go to [email or web address] - password [email or web address] [facility]  Office  [phone]  CC: Primary care physician; Dion Body, MD

## 2015-07-18 ENCOUNTER — Inpatient Hospital Stay
Admit: 2015-07-18 | Discharge: 2015-07-18 | Disposition: A | Discharge status: Home / Self Care | Payer: Medicare Other | Attending: Internal Medicine | Admitting: Internal Medicine

## 2015-07-18 ENCOUNTER — Inpatient Hospital Stay: Payer: Medicare Other

## 2015-07-18 ENCOUNTER — Encounter: Payer: Self-pay | Admitting: Internal Medicine

## 2015-07-18 DIAGNOSIS — E119 Type 2 diabetes mellitus without complications: Secondary | ICD-10-CM

## 2015-07-18 DIAGNOSIS — R0602 Shortness of breath: Secondary | ICD-10-CM

## 2015-07-18 DIAGNOSIS — I2699 Other pulmonary embolism without acute cor pulmonale: Secondary | ICD-10-CM | POA: Diagnosis not present

## 2015-07-18 DIAGNOSIS — Z79899 Other long term (current) drug therapy: Secondary | ICD-10-CM

## 2015-07-18 LAB — COMPREHENSIVE METABOLIC PANEL
ALK PHOS: 38 U/L (ref 38–126)
ALT: 75 U/L — AB (ref 17–63)
ANION GAP: 5 (ref 5–15)
AST: 73 U/L — ABNORMAL HIGH (ref 15–41)
Albumin: 3.2 g/dL — ABNORMAL LOW (ref 3.5–5.0)
BILIRUBIN TOTAL: 0.1 mg/dL — AB (ref 0.3–1.2)
BUN: 18 mg/dL (ref 6–20)
CO2: 27 mmol/L (ref 22–32)
CREATININE: 1.02 mg/dL (ref 0.61–1.24)
Calcium: 8.7 mg/dL — ABNORMAL LOW (ref 8.9–10.3)
Chloride: 109 mmol/L (ref 101–111)
GFR calc Af Amer: >60 mL/min (ref >60)
GLUCOSE: 116 mg/dL — AB (ref 65–99)
Potassium: 3.8 mmol/L (ref 3.5–5.1)
Sodium: 141 mmol/L (ref 135–145)
Total Protein: 6 g/dL — ABNORMAL LOW (ref 6.5–8.1)

## 2015-07-18 LAB — TROPONIN I
TROPONIN I: 1.94 ng/mL — AB (ref <0.031)
TROPONIN I: 1.29 ng/mL — AB (ref <0.031)

## 2015-07-18 LAB — CBC
HCT: 37 % — ABNORMAL LOW (ref 40.0–52.0)
Hemoglobin: 11.7 g/dL — ABNORMAL LOW (ref 13.0–18.0)
MCH: 27.6 pg (ref 26.0–34.0)
MCHC: 31.6 g/dL — ABNORMAL LOW (ref 32.0–36.0)
MCV: 87.4 fL (ref 80.0–100.0)
PLATELETS: 323 10*3/uL (ref 150–440)
RBC: 4.23 MIL/uL — ABNORMAL LOW (ref 4.40–5.90)
RDW: 15.8 % — ABNORMAL HIGH (ref 11.5–14.5)
WBC: 8.8 10*3/uL (ref 3.8–10.6)

## 2015-07-18 LAB — GLUCOSE, CAPILLARY
Glucose-Capillary: 173 mg/dL — ABNORMAL HIGH (ref 65–99)
Glucose-Capillary: 153 mg/dL — ABNORMAL HIGH (ref 65–99)
Glucose-Capillary: 198 mg/dL — ABNORMAL HIGH (ref 65–99)

## 2015-07-18 LAB — HEPARIN LEVEL (UNFRACTIONATED)
HEPARIN UNFRACTIONATED: 0.36 [IU]/mL (ref 0.30–0.70)
Heparin Unfractionated: 0.32 IU/mL (ref 0.30–0.70)
Heparin Unfractionated: 0.55 IU/mL (ref 0.30–0.70)

## 2015-07-18 LAB — PROTIME-INR
INR: 1.11
PROTHROMBIN TIME: 14.5 s (ref 11.4–15.0)

## 2015-07-18 MED ORDER — HEPARIN (PORCINE) IN NACL 100-0.45 UNIT/ML-% IJ SOLN
1700.0000 [IU]/h | INTRAMUSCULAR | Status: DC
  Administered 2015-07-18: 1450 [IU]/h via INTRAVENOUS
  Administered 2015-07-19 – 2015-07-20 (×2): 1550 [IU]/h via INTRAVENOUS
  Filled 2015-07-18 (×6): qty 250

## 2015-07-18 NOTE — Consult Note (Signed)
PULMONARY/CCM CONSULT NOTE  Requesting MD/Service: Internal Medicine Date of admission: 7/31 Date of consult: 8/01 Reason for consultation: PE   HPI:  60 M with hx of myotonic dystrophy first diagnosed 3 yrs prior to admission with initial symptoms onset approx 4 yrs ago. He is somewhat sedentary due to his nyotonic dystrophy but is ambulatory @ baseline. On the day of admission he developed lightheadedness, dyspnea and difficulty concentrating. On presentation, a CTA chest revealed large bilateral PEs. He was never hypotensive but the RV/LV ratio was 1.65 prompting consultation to consider lytic therapy. Presently, he is in no distress and is on 4 lpm [place] O2. He denies CP and hemoptysis.   He has no identifiable VTE RFs including no prolonged immobilization and no recent surgeries. His brother has also suffered PE in the past which was apparently large and was treated with thrombolytics. His brother is on lifelong rivaroxaban therapy.   He denies LE edema and calf tenderness. His Trop I was mildly elevated on admission  Past Medical History  Diagnosis Date  . Myotonic dystrophy   . 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 Marital Status: Married    Spouse Name: N/A  . Number of Children: N/A  . Years of Education: N/A   Occupational History  . Not on file.   Social History Main Topics  . Smoking status: Former Research scientist (life sciences)  . Smokeless tobacco: Not on file  . Alcohol Use: Yes     Comment: occasionally  . Drug Use: Not on file  . Sexual Activity: Not on file   Other Topics Concern  . Not on file   Social History Narrative    Family History  Problem Relation Age of Onset  . Pulmonary embolism Brother     ROS - Review of Systems  Constitutional: Negative.   HENT: Negative.   Eyes: Negative.   Respiratory: Positive for shortness of breath.   Cardiovascular: Negative.   Gastrointestinal: Negative.    Genitourinary: Negative.   Musculoskeletal: Negative.   Skin: Negative.   Neurological: Positive for dizziness.  Endo/Heme/Allergies: Negative.   Psychiatric/Behavioral: Negative.      Filed Vitals:   07/18/15 1000 07/18/15 1100 07/18/15 1200 07/18/15 1300  BP: 121/84 119/79 105/81 118/74  Pulse: 83 79 87 81  Temp:  97.6 F (36.4 C)    TempSrc:  Oral    Resp: 20 18 20 17   Height:      Weight:      SpO2: 94% 95% 94% 94%    EXAM:  Gen: NAD HEENT: slight lid lag, otherwise WNL Neck: No JVD noted Lungs: clear to auscultation and percussion Cardiovascular: reg, no M Abdomen: NABS, soft, NT Ext: miinimal L calf edema Neuro: no focal deficits  DATA:  BMET    Component Value Date/Time   NA 141 07/18/2015 0326   NA 141 01/21/2012 0623   K 3.8 07/18/2015 0326   K 4.6 01/21/2012 0623   CL 109 07/18/2015 0326   CL 106 01/21/2012 0623   CO2 27 07/18/2015 0326   CO2 27 01/21/2012 0623   GLUCOSE 116* 07/18/2015 0326   GLUCOSE 191* 01/21/2012 0623   BUN 18 07/18/2015 0326   BUN 23* 01/21/2012 0623   CREATININE 1.02 07/18/2015 0326   CREATININE 0.89 01/21/2012 0623   CALCIUM 8.7* 07/18/2015 0326   CALCIUM 9.3 01/21/2012 0623   GFRNONAA >60 07/18/2015 0326   GFRAA >  60 07/18/2015 0326    CBC    Component Value Date/Time   WBC 8.8 07/18/2015 0326   WBC 8.4 01/21/2012 0623   RBC 4.23* 07/18/2015 0326   RBC 4.45 01/21/2012 0623   HGB 11.7* 07/18/2015 0326   HGB 13.1 01/21/2012 0623   HCT 37.0* 07/18/2015 0326   HCT 40.1 01/21/2012 0623   PLT 323 07/18/2015 0326   PLT 226 01/21/2012 0623   MCV 87.4 07/18/2015 0326   MCV 90 01/21/2012 0623   MCH 27.6 07/18/2015 0326   MCH 29.5 01/21/2012 0623   MCHC 31.6* 07/18/2015 0326   MCHC 32.7 01/21/2012 0623   RDW 15.8* 07/18/2015 0326   RDW 14.4 01/21/2012 0623   CXR: mild bibasilar atelectasis CT chest: large bilateral PE. Dilated RV EKG: No S1Q3T3 pattern. NSST changes LE venous Doppler scans: small residual DVT on  L  IMPRESSION:   Large bilateral PEs - four major questions arise:   1) Why? The answer to this is unclear but might in part be related to relative immobilization. Also, a positive FH raises concern for a hypercoagulable state  2) Initial therapy (i.e lytics + anticoagulation vs anticoagulation alone)? Role of IVC filter? There are many who could make the argument for lytic therapy given the clot burden and appearance of RV overload on CT. However, he is well compensated with no hypotension and modest O2 requirements. The small burden of residual LLE clot argues against an IVC filter  3) Duration of therapy? Given that this is a very large and unprovoked clot and that his degree of immobilization is likely to worsen over time, I favor lifelong anticoagulation  4) which oral anticoagulant? Since he is prone to falls and since warfarin would present significant bleeding risk if he is excessively anticoagulated, I favor one of the NOACs  His evaluation thus far has been very thoughtful. Elevated troponin I is common in large PE and does not warrant further eval in my opinion  PLAN/REC:  1) continue UFH per pharmacy dosing for now. I have instructed to try to keep him on the high side of the therapeutic range.  2) Begin Rivaroxaban (Xarelto) in the next day or two. Plan lifelong therapy  3) No indication for thrombolytics or IVC filter for now.   3) would limit ambulation for next 48 hours to ensure stabilization of LE clot  4) Follow up on Echocardiogram results to assess extent of RV dysfunction. If significant, we can reconsider the issue of thrombolytic therapy. Also, if significant, it might warrant repeat echocardiogram in 6-8 wks to ensure resolution  5) follow up on hypercoag panel though not likely to affect my recommendation for lifelong therapy. It would at least be explanatory  6) repeat EKG in AM. Otherwise, no further eval for elevated trop I indicated  I have discussed all of  the above with the patient and his parents   Merton Border, MD ; [facility] service Mobile [phone].

## 2015-07-18 NOTE — Progress Notes (Signed)
Notified MD on call, Dr. Lavetta Nielsen, about pink urine with patient on Heparin drip. No new orders. Patient's vital signs table, CBC already ordered in the morning. RN informed MD that catheter was not secured when this RN came on shift (had come out of securement device, this RN replaced securement device) and patient had complained it was pulling before new securement device attached.

## 2015-07-18 NOTE — Progress Notes (Signed)
[facility] NAME: Bradley Bradley    MR#:  [id number]  DATE OF BIRTH:  [birth year]  SUBJECTIVE:  CHIEF COMPLAINT:   Chief Complaint  Patient presents with  . Weakness    REVIEW OF SYSTEMS:  CONSTITUTIONAL: No fever, fatigue or weakness.  EYES: No blurred or double vision.  EARS, NOSE, AND THROAT: No tinnitus or ear pain.  RESPIRATORY: No cough, shortness of breath, wheezing or hemoptysis.  CARDIOVASCULAR: No chest pain, orthopnea, edema.  GASTROINTESTINAL: No nausea, vomiting, diarrhea or abdominal pain.  GENITOURINARY: No dysuria, hematuria.  ENDOCRINE: No polyuria, nocturia,  HEMATOLOGY: No anemia, easy bruising or bleeding SKIN: No rash or lesion. MUSCULOSKELETAL: No joint pain or arthritis.   NEUROLOGIC: No tingling, numbness, weakness.  PSYCHIATRY: No anxiety or depression.   DRUG ALLERGIES:  No Known Allergies  VITALS:  Blood pressure 118/84, pulse 89, temperature 97.8 F (36.6 C), temperature source Oral, resp. rate 20, height 5\' 5"  (1.651 m), weight 79.379 kg (175 lb), SpO2 99 %.  PHYSICAL EXAMINATION:  GENERAL:  49 y.o.-year-old patient lying in the bed with no acute distress.  EYES: Pupils equal, round, reactive to light and accommodation. No scleral icterus. Extraocular muscles intact.  HEENT: Head atraumatic, normocephalic. Oropharynx and nasopharynx clear.  NECK:  Supple, no jugular venous distention. No thyroid enlargement, no tenderness.  LUNGS: Normal breath sounds bilaterally, no wheezing, rales,rhonchi or crepitation. No use of accessory muscles of respiration.  CARDIOVASCULAR: S1, S2 normal. No murmurs, rubs, or gallops.  ABDOMEN: Soft, nontender, nondistended. Bowel sounds present. No organomegaly or mass.  EXTREMITIES: No pedal edema, cyanosis, or clubbing.  NEUROLOGIC: Cranial nerves II through XII are intact. Muscle strength 5/5 in all extremities. Sensation intact. Gait not checked.   PSYCHIATRIC: The patient is alert and oriented x 3.  SKIN: No obvious rash, lesion, or ulcer.    LABORATORY PANEL:   CBC  Recent Labs Lab 07/18/15 0326  WBC 8.8  HGB 11.7*  HCT 37.0*  PLT 323   ------------------------------------------------------------------------------------------------------------------  Chemistries   Recent Labs Lab 07/18/15 0326  NA 141  K 3.8  CL 109  CO2 27  GLUCOSE 116*  BUN 18  CREATININE 1.02  CALCIUM 8.7*  AST 73*  ALT 75*  ALKPHOS 38  BILITOT 0.1*   ------------------------------------------------------------------------------------------------------------------  Cardiac Enzymes  Recent Labs Lab 07/18/15 0334  TROPONINI 1.94*   ------------------------------------------------------------------------------------------------------------------  RADIOLOGY:  Ct Angio Chest Pe W/cm &/or Wo Cm  07/17/2015   CLINICAL DATA:  Myotonic dystrophy. Weakness. Dizziness. Nausea and hyperglycemia. Initial encounter. Pulmonary embolism.  EXAM: CT ANGIOGRAPHY CHEST WITH CONTRAST  TECHNIQUE: Multidetector CT imaging of the chest was performed using the standard protocol during bolus administration of intravenous contrast. Multiplanar CT image reconstructions and MIPs were obtained to evaluate the vascular anatomy.  CONTRAST:  77mL OMNIPAQUE IOHEXOL 350 MG/ML SOLN  COMPARISON:  None.  FINDINGS: Bones: No aggressive osseous lesions. dextroconvex thoracic scoliosis. Scattered Schmorl's nodes throughout the thoracic spine. 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 paraspinal muscular atrophy, compatible with myotonic dystrophy.  Cardiovascular: Large bilateral pulmonary emboli are present, straddling the bifurcation of the LEFT and RIGHT pulmonary arteries. Embolus is present in all lobes of both lungs. There is also RIGHT heart strain. The LEFT ventricle measures 26 mm and the RIGHT ventricle measures 43 mm.  Lungs: Basilar  atelectasis. Small focus of ground-glass attenuation in the anterior RIGHT upper lobe potentially represents pulmonary infarction.  Central airways: Patent.  Effusions: None.  Lymphadenopathy: No axillary adenopathy. No mediastinal or hilar adenopathy.  Esophagus: Normal.  Upper abdomen: Either fat containing subxiphoid hernia or a laxity of the anterior abdominal wall in the subxiphoid region. This area is only partially visualized. No gross acute abnormality in the upper abdomen.  Other: None.  Review of the MIP images confirms the above findings.  IMPRESSION: 1. Bilateral pulmonary emboli. Positive for acute PE with CT evidence of right heart strain (RV/LV Ratio = 1.65) consistent with at least submassive (intermediate risk) PE. The presence of right heart strain has been associated with an increased risk of morbidity and mortality. Please activate Code PE by paging [phone]. 2. Probable small pulmonary infarct in the peripheral RIGHT upper lobe. 3. Low volumes with basilar atelectasis. 4. 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 5. Critical Value/emergent results were called by telephone at the time of interpretation on 07/17/2015 at 5:19 pm to charge nurse Romie Minus, who verbally acknowledged these results.   Electronically Signed   By: Dereck Ligas M.D.   On: 07/17/2015 17:18   Dg Chest Portable 1 View  07/17/2015   CLINICAL DATA:  Weakness, nonsmoker.  EXAM: PORTABLE CHEST - 1 VIEW  COMPARISON:  None.  FINDINGS: Cardiac silhouette is enlarged. There are low lung volumes. There is mild linear atelectasis at the lung bases. No pulmonary edema or infiltrate.  IMPRESSION: Low lung volumes and bibasilar atelectasis.   Electronically Signed   By: Suzy Bouchard M.D.   On: 07/17/2015 13:30    EKG:   Orders placed or performed in visit on 07/17/15  . EKG 12-Lead  . EKG 12-Lead    ASSESSMENT AND PLAN:   1. Acute respiratory distress secondary to  bilateral submassive pulmonary embolism with right heart strain -etiology unclear   continue heparin drip, follow up Bilateral lower extremity venous Dopplers to rule out DVT, Oncology consult and Pulmonology consult.  2. Elevated troponin with right heart strain Continue heparin drip, get echo and cardiology consult.  3. 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 Hyperkalemia  Improved after Kayexalate 1    All the records are reviewed and case discussed with Care Management/Social Workerr. Management plans discussed with the patient, his wife and they are in agreement.  CODE STATUS: Full code  TOTAL CRITICAL TIME TAKING CARE OF THIS PATIENT: 46 minutes.   POSSIBLE D/C IN 3 DAYS, DEPENDING ON CLINICAL CONDITION.   Demetrios Loll M.D on 07/18/2015 at 9:03 AM  Between 7am to 6pm - Pager - [phone]  After 6pm go to [email or web address] - password [email or web address]  Office  [phone]  CC: Primary care physician; Dion Body, MD

## 2015-07-18 NOTE — Progress Notes (Signed)
Pt remains alert and oriented on 4L O2 via nasal canula with O2 sats mid 90's; vss; NSR on cardiac monitor;  foley placed per Dr. Lianne Moris verbal orders due to urinary retention; pt currently having adequate uop via foley; c/o pain during shift administered pain medication per md orders pt stated pain level has improved; pts O2 sats decreased with exertion will maintain bedrest for the next 48 hrs per Dr. Alva Garnet orders; pt nauseated X1 during shift administered zofran pt received relief; per Dr. Shawna Orleans pt will remain stepdown status in ICU; pts family updated about pt status and questions answered will continue to monitor and assess pt

## 2015-07-18 NOTE — Consult Note (Signed)
[facility]  Telephone:(336) [phone] Fax:(336) [phone]  ID: [place] OB: 1966/03/05  MR#: [id number]  POE#:[id number]  Patient Care Team: Dion Body, MD as PCP - General (Family Medicine)  CHIEF COMPLAINT:  Chief Complaint  Patient presents with  . Weakness    INTERVAL HISTORY: Patient is a 49 year old male who noted increasing shortness of breath approximately one week ago. He also had increasing weakness and fatigue. Upon evaluation in the emergency room, he was found to have a sub-massive pulmonary embolism with right heart strain. Currently, he continues to have shortness of breath but otherwise feels well.  REVIEW OF SYSTEMS:   Review of Systems  Constitutional: Negative.   Respiratory: Positive for shortness of breath.   Cardiovascular: Negative.   Gastrointestinal: Negative.   Musculoskeletal: Negative.   Neurological: Negative.     As per HPI. Otherwise, a complete review of systems is negatve.  PAST MEDICAL HISTORY: Past Medical History  Diagnosis Date  . Myotonic dystrophy   . Diabetes mellitus without complication     TYPE ONE    PAST SURGICAL HISTORY: Past Surgical History  Procedure Laterality Date  . Joint replacement    . Eye surgery    . Dental surgery      FAMILY HISTORY Family History  Problem Relation Age of Onset  . Pulmonary embolism Brother        ADVANCED DIRECTIVES:    HEALTH MAINTENANCE: History  Substance Use Topics  . Smoking status: Former Research scientist (life sciences)  . Smokeless tobacco: Not on file  . Alcohol Use: Yes     Comment: occasionally     Colonoscopy:  PAP:  Bone density:  Lipid panel:  No Known Allergies  Current Facility-Administered Medications  Medication Dose Route Frequency Provider Last Rate Last Dose  . 0.9 %  sodium chloride infusion   Intravenous Continuous Wilhelmina Mcardle, MD 10 mL/hr at 07/18/15 1002    . acetaminophen (TYLENOL) tablet 650 mg  650 mg Oral Q6H PRN Nicholes Mango, MD   650 mg at 07/17/15 2216   Or  . acetaminophen (TYLENOL) suppository 650 mg  650 mg Rectal Q6H PRN Nicholes Mango, MD      . antiseptic oral rinse (CPC / CETYLPYRIDINIUM CHLORIDE 0.05%) solution 7 mL  7 mL Mouth Rinse BID Nicholes Mango, MD   7 mL at 07/18/15 0931  . aspirin tablet 162.5 mg  162.5 mg Oral QHS Nicholes Mango, MD   162.5 mg at 07/17/15 2216  . azelastine (ASTELIN) 0.1 % nasal spray 2 spray  2 spray Each Nare BID PRN Nicholes Mango, MD      . docusate sodium (COLACE) capsule 100 mg  100 mg Oral BID Nicholes Mango, MD   100 mg at 07/18/15 0910  . fenofibrate tablet 160 mg  160 mg Oral Daily Nicholes Mango, MD   160 mg at 07/18/15 0910  . heparin ADULT infusion 100 units/mL (25000 units/250 mL)  1,450 Units/hr Intravenous Continuous Demetrios Loll, MD 14.5 mL/hr at 07/18/15 1147 1,450 Units/hr at 07/18/15 1147  . insulin aspart (novoLOG) injection 0-9 Units  0-9 Units Subcutaneous TID WC Nicholes Mango, MD   2 Units at 07/18/15 1147  . insulin aspart (novoLOG) injection 13 Units  13 Units Subcutaneous BID Nicholes Mango, MD   13 Units at 07/18/15 0913  . ondansetron (ZOFRAN) tablet 4 mg  4 mg Oral Q6H PRN Nicholes Mango, MD       Or  . ondansetron (ZOFRAN) injection 4 mg  4 mg Intravenous Q6H PRN Nicholes Mango, MD   4 mg at 07/18/15 1125  . oxyCODONE (Oxy IR/ROXICODONE) immediate release tablet 5 mg  5 mg Oral Q4H PRN Nicholes Mango, MD   5 mg at 07/18/15 0753  . pravastatin (PRAVACHOL) tablet 40 mg  40 mg Oral QHS Nicholes Mango, MD   40 mg at 07/17/15 2218  . sodium chloride 0.9 % injection 3 mL  3 mL Intravenous Q12H Nicholes Mango, MD   3 mL at 07/17/15 2200    OBJECTIVE: Filed Vitals:   07/18/15 1100  BP: 119/79  Pulse: 79  Temp: 97.6 F (36.4 C)  Resp: 18     Body mass index is 29.12 kg/(m^2).    ECOG FS:0 - Asymptomatic  General: Well-developed, well-nourished, no acute distress. Eyes: Pink conjunctiva, anicteric sclera. HEENT: Normocephalic, moist mucous membranes, clear oropharnyx. Lungs: Clear  to auscultation bilaterally. Heart: Regular rate and rhythm. No rubs, murmurs, or gallops. Abdomen: Soft, nontender, nondistended. No organomegaly noted, normoactive bowel sounds. Musculoskeletal: No edema, cyanosis, or clubbing. Neuro: Alert, answering all questions appropriately. Cranial nerves grossly intact. Skin: No rashes or petechiae noted. Psych: Normal affect. Lymphatics: No cervical, calvicular, axillary or inguinal LAD.   LAB RESULTS:  Lab Results  Component Value Date   NA 141 07/18/2015   K 3.8 07/18/2015   CL 109 07/18/2015   CO2 27 07/18/2015   GLUCOSE 116* 07/18/2015   BUN 18 07/18/2015   CREATININE 1.02 07/18/2015   CALCIUM 8.7* 07/18/2015   PROT 6.0* 07/18/2015   ALBUMIN 3.2* 07/18/2015   AST 73* 07/18/2015   ALT 75* 07/18/2015   ALKPHOS 38 07/18/2015   BILITOT 0.1* 07/18/2015   GFRNONAA >60 07/18/2015   GFRAA >60 07/18/2015    Lab Results  Component Value Date   WBC 8.8 07/18/2015   HGB 11.7* 07/18/2015   HCT 37.0* 07/18/2015   MCV 87.4 07/18/2015   PLT 323 07/18/2015     STUDIES: Ct Angio Chest Pe W/cm &/or Wo Cm  07/17/2015   CLINICAL DATA:  Myotonic dystrophy. Weakness. Dizziness. Nausea and hyperglycemia. Initial encounter. Pulmonary embolism.  EXAM: CT ANGIOGRAPHY CHEST WITH CONTRAST  TECHNIQUE: Multidetector CT imaging of the chest was performed using the standard protocol during bolus administration of intravenous contrast. Multiplanar CT image reconstructions and MIPs were obtained to evaluate the vascular anatomy.  CONTRAST:  51mL OMNIPAQUE IOHEXOL 350 MG/ML SOLN  COMPARISON:  None.  FINDINGS: Bones: No aggressive osseous lesions. dextroconvex thoracic scoliosis. Scattered Schmorl's nodes throughout the thoracic spine. 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 paraspinal muscular atrophy, compatible with myotonic dystrophy.  Cardiovascular: Large bilateral pulmonary emboli are present, straddling the bifurcation of the  LEFT and RIGHT pulmonary arteries. Embolus is present in all lobes of both lungs. There is also RIGHT heart strain. The LEFT ventricle measures 26 mm and the RIGHT ventricle measures 43 mm.  Lungs: Basilar atelectasis. Small focus of ground-glass attenuation in the anterior RIGHT upper lobe potentially represents pulmonary infarction.  Central airways: Patent.  Effusions: None.  Lymphadenopathy: No axillary adenopathy. No mediastinal or hilar adenopathy.  Esophagus: Normal.  Upper abdomen: Either fat containing subxiphoid hernia or a laxity of the anterior abdominal wall in the subxiphoid region. This area is only partially visualized. No gross acute abnormality in the upper abdomen.  Other: None.  Review of the MIP images confirms the above findings.  IMPRESSION: 1. Bilateral pulmonary emboli. Positive for acute PE with CT evidence of right heart strain (RV/LV Ratio = 1.65)  consistent with at least submassive (intermediate risk) PE. The presence of right heart strain has been associated with an increased risk of morbidity and mortality. Please activate Code PE by paging [phone]. 2. Probable small pulmonary infarct in the peripheral RIGHT upper lobe. 3. Low volumes with basilar atelectasis. 4. 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 5. Critical Value/emergent results were called by telephone at the time of interpretation on 07/17/2015 at 5:19 pm to charge nurse Romie Minus, who verbally acknowledged these results.   Electronically Signed   By: Dereck Ligas M.D.   On: 07/17/2015 17:18   US Venous Img Lower Bilateral  07/18/2015   CLINICAL DATA:  Diabetes.  Pulmonary embolus.  EXAM: BILATERAL LOWER EXTREMITY VENOUS DOPPLER ULTRASOUND  TECHNIQUE: Gray-scale sonography with graded compression, as well as color Doppler and duplex ultrasound were performed to evaluate the lower extremity deep venous systems from the level of the common femoral vein and including the common  femoral, femoral, profunda femoral, popliteal and calf veins including the posterior tibial, peroneal and gastrocnemius veins when visible. The superficial great saphenous vein was also interrogated. Spectral Doppler was utilized to evaluate flow at rest and with distal augmentation maneuvers in the common femoral, femoral and popliteal veins.  COMPARISON:  CT 07/17/2015.  FINDINGS: RIGHT LOWER EXTREMITY  Common Femoral Vein: No evidence of thrombus. Normal compressibility, respiratory phasicity and response to augmentation.  Saphenofemoral Junction: No evidence of thrombus. Normal compressibility and flow on color Doppler imaging.  Profunda Femoral Vein: No evidence of thrombus. Normal compressibility and flow on color Doppler imaging.  Femoral Vein: No evidence of thrombus. Normal compressibility, respiratory phasicity and response to augmentation.  Popliteal Vein: No evidence of thrombus. Normal compressibility, respiratory phasicity and response to augmentation.  Calf Veins: No evidence of thrombus. Normal compressibility and flow on color Doppler imaging.  Superficial Great Saphenous Vein: No evidence of thrombus. Normal compressibility and flow on color Doppler imaging.  Venous Reflux:  None.  Other Findings:  None.  LEFT LOWER EXTREMITY  Common Femoral Vein: No evidence of thrombus. Normal compressibility, respiratory phasicity and response to augmentation.  Saphenofemoral Junction: No evidence of thrombus. Normal compressibility and flow on color Doppler imaging.  Profunda Femoral Vein: No evidence of thrombus. Normal compressibility and flow on color Doppler imaging.  Femoral Vein: Thrombosis noted throughout the left superficial femoral vein. The vein is noncompressible.  Popliteal Vein: Thrombus is noted within the left popliteal vein. The left popliteal vein is noncompressible.  Calf Veins: Thrombus is noted in the posterior tibial vein. The posterior tibial vein is noncompressible.  Superficial Great  Saphenous Vein: No evidence of thrombus. Normal compressibility and flow on color Doppler imaging.  Venous Reflux:  None.  Other Findings:  None.  IMPRESSION: Left lower extremity deep venous thrombosis. Right lower extremity unremarkable.   Electronically Signed   By: Marcello Moores  Register   On: 07/18/2015 10:57   Dg Chest Portable 1 View  07/17/2015   CLINICAL DATA:  Weakness, nonsmoker.  EXAM: PORTABLE CHEST - 1 VIEW  COMPARISON:  None.  FINDINGS: Cardiac silhouette is enlarged. There are low lung volumes. There is mild linear atelectasis at the lung bases. No pulmonary edema or infiltrate.  IMPRESSION: Low lung volumes and bibasilar atelectasis.   Electronically Signed   By: Suzy Bouchard M.D.   On: 07/17/2015 13:30    ASSESSMENT: Bilateral PE with left DVT.  PLAN:    1. PE/DVT: Patient does not appear to have any transient  risk factors as the etiology of his blood clots. He has a family history of blood clots in his brother. A full hypercoagulable workup has been initiated and is currently pending. For completeness, tumor markers have been sent as well. Patient will require a minimum of 6 months of anticoagulation. Given the extent of his pulmonary embolism, will consider lifelong therapy at a later date. No thrombectomy per vascular surgery. No further intervention is needed. Once patient is discharged, he can follow-up in the [facility] in 1-2 weeks for further evaluation and discussion of his hypercoagulable workup.  Appreciate consult, will follow.  Lloyd Huger, MD   07/18/2015 12:55 PM

## 2015-07-18 NOTE — Progress Notes (Signed)
Troponin level of 1.7 called from lab.  Dr. Lavetta Nielsen notified of bump in troponin.  Patient on heparin drip, cardiology consult ordered, received aspirin.  No new order received.

## 2015-07-18 NOTE — Progress Notes (Signed)
Inpatient Diabetes Program Recommendations  AACE/ADA: New Consensus Statement on Inpatient Glycemic Control (2013)  Target Ranges:  Prepandial:   less than 140 mg/dL      Peak postprandial:   less than 180 mg/dL (1-2 hours)      Critically ill patients:  140 - 180 mg/dL   Inpatient Diabetes Program Recommendations Insulin - Basal: add Lantus 15 units   Note: H&P states patient has Type-1 DM but no long-acting insulin noted.  True Type-1 needs long-acting basal.  Novolog 13 units BID not recommended during this admission.  Recommend discontinuing Novolog 13 BID and add basal. Will likely need prandial Novolog coverage as well. Will follow. Thank you  Raoul Pitch BSN, RN,CDE Inpatient Diabetes Coordinator [phone] (team pager)

## 2015-07-18 NOTE — Progress Notes (Signed)
ANTICOAGULATION CONSULT NOTE - Follow Up Consult  Pharmacy Consult for Heparin  Indication: pulmonary embolus  No Known Allergies  Patient Measurements: Height: 5\' 5"  (165.1 cm) Weight: 175 lb (79.379 kg) IBW/kg (Calculated) : 61.5 Heparin Dosing Weight:  77.6 kg   Vital Signs: Temp: 98.3 F (36.8 C) (08/01 1700) Temp Source: Axillary (08/01 1700) BP: 81/52 mmHg (08/01 1800) Pulse Rate: 88 (08/01 1800)  Labs:  Recent Labs  07/17/15 1239 07/17/15 2149 07/18/15 0031 07/18/15 0326 07/18/15 0334 07/18/15 0805 07/18/15 1717  HGB 13.0  --   --  11.7*  --   --   --   HCT 41.6  --   --  37.0*  --   --   --   PLT 335  --   --  323  --   --   --   LABPROT  --   --   --  14.5  --   --   --   INR  --   --   --  1.11  --   --   --   HEPARINUNFRC  --   --  0.32  --   --  0.36 0.55  CREATININE 1.32*  --   --  1.02  --   --   --   TROPONINI 0.11* 1.70*  --   --  1.94* 1.29*  --     Estimated Creatinine Clearance: 86.1 mL/min (by C-G formula based on Cr of 1.02).   Medical History: Past Medical History  Diagnosis Date  . Myotonic dystrophy   . Diabetes mellitus without complication     TYPE ONE    Medications:  Prescriptions prior to admission  Medication Sig Dispense Refill Last Dose  . amoxicillin-clavulanate (AUGMENTIN) 875-125 MG per tablet Take 1 tablet by mouth 2 (two) times daily.   07/16/2015 at Unknown time  . aspirin 325 MG tablet Take 162.5 mg by mouth at bedtime.   07/16/2015 at Unknown time  . azelastine (ASTELIN) 0.1 % nasal spray Place 2 sprays into both nostrils 2 (two) times daily as needed for rhinitis. Use in each nostril as directed   Past Week at Unknown time  . b complex vitamins tablet Take 1 tablet by mouth at bedtime.   07/16/2015 at Unknown time  . Cinnamon 500 MG capsule Take 500 mg by mouth every other day.    07/16/2015 at Unknown time  . Coenzyme Q10 (CO Q 10 PO) Take 1 tablet by mouth at bedtime.   07/16/2015 at Unknown time  . fenofibrate  (TRICOR) 145 MG tablet Take 145 mg by mouth at bedtime.   07/16/2015 at Unknown time  . Ginger, Zingiber officinalis, 500 MG CAPS Take 1 capsule by mouth every other day.   07/16/2015 at Unknown time  . glucosamine-chondroitin 500-400 MG tablet Take 0.5 tablets by mouth at bedtime.   07/16/2015 at Unknown time  . insulin aspart (NOVOLOG) 100 UNIT/ML injection Inject 12.5 Units into the skin 2 (two) times daily.   07/17/2015 at Unknown time  . Insulin Glulisine (APIDRA SOLOSTAR) 100 UNIT/ML Solostar Pen Inject 10-25 Units into the skin 2 (two) times daily. Taken per sliding scale   07/17/2015 at Unknown time  . lansoprazole (PREVACID) 15 MG capsule Take 15 mg by mouth daily at 12 noon.   07/16/2015 at Unknown time  . lisinopril-hydrochlorothiazide (PRINZIDE,ZESTORETIC) 10-12.5 MG per tablet Take 0.5 tablets by mouth at bedtime.   07/16/2015 at Unknown time  . metFORMIN (GLUCOPHAGE) 500  MG tablet Take 1,000 mg by mouth 2 (two) times daily with a meal.   07/16/2015 at Unknown time  . Multiple Vitamin (MULTIVITAMIN) tablet Take 1 tablet by mouth at bedtime.   07/16/2015 at Unknown time  . pravastatin (PRAVACHOL) 40 MG tablet Take 40 mg by mouth at bedtime.   07/16/2015 at Unknown time    Assessment: Bilateral PE  Goal of Therapy:  Heparin level 0.3-0.7 units/ml Monitor platelets by anticoagulation protocol: Yes   Plan:  HL of 0.36 at 0805.  Per MD, would like HL to be within the upper limit of 0.3-0.7.  Will increase Heparin drip to 1450 units/hr and recheck HL in 6 hours at 1730.  CBC ordered in AM.    8/1 at 17:17 -- Anti-Xa = 0.55.  Continue current dosing.   Next Anti-Xa level  8/1 at 23:00  Olivia Canter, [facility] Clinical Pharmacist 07/18/2015

## 2015-07-18 NOTE — Progress Notes (Signed)
ANTICOAGULATION CONSULT NOTE - Follow Up Consult  Pharmacy Consult for Heparin  Indication: pulmonary embolus  No Known Allergies  Patient Measurements: Height: 5\' 5"  (165.1 cm) Weight: 175 lb (79.379 kg) IBW/kg (Calculated) : 61.5 Heparin Dosing Weight:  77.6 kg   Vital Signs: Temp: 97.8 F (36.6 C) (08/01 0800) Temp Source: Oral (08/01 0800) BP: 113/83 mmHg (08/01 0900) Pulse Rate: 84 (08/01 0900)  Labs:  Recent Labs  07/17/15 1239 07/17/15 2149 07/18/15 0031 07/18/15 0326 07/18/15 0334 07/18/15 0805  HGB 13.0  --   --  11.7*  --   --   HCT 41.6  --   --  37.0*  --   --   PLT 335  --   --  323  --   --   LABPROT  --   --   --  14.5  --   --   INR  --   --   --  1.11  --   --   HEPARINUNFRC  --   --  0.32  --   --  0.36  CREATININE 1.32*  --   --  1.02  --   --   TROPONINI 0.11* 1.70*  --   --  1.94* 1.29*    Estimated Creatinine Clearance: 86.1 mL/min (by C-G formula based on Cr of 1.02).   Medical History: Past Medical History  Diagnosis Date  . Myotonic dystrophy   . Diabetes mellitus without complication     TYPE ONE    Medications:  Prescriptions prior to admission  Medication Sig Dispense Refill Last Dose  . amoxicillin-clavulanate (AUGMENTIN) 875-125 MG per tablet Take 1 tablet by mouth 2 (two) times daily.   07/16/2015 at Unknown time  . aspirin 325 MG tablet Take 162.5 mg by mouth at bedtime.   07/16/2015 at Unknown time  . azelastine (ASTELIN) 0.1 % nasal spray Place 2 sprays into both nostrils 2 (two) times daily as needed for rhinitis. Use in each nostril as directed   Past Week at Unknown time  . b complex vitamins tablet Take 1 tablet by mouth at bedtime.   07/16/2015 at Unknown time  . Cinnamon 500 MG capsule Take 500 mg by mouth every other day.    07/16/2015 at Unknown time  . Coenzyme Q10 (CO Q 10 PO) Take 1 tablet by mouth at bedtime.   07/16/2015 at Unknown time  . fenofibrate (TRICOR) 145 MG tablet Take 145 mg by mouth at bedtime.    07/16/2015 at Unknown time  . Ginger, Zingiber officinalis, 500 MG CAPS Take 1 capsule by mouth every other day.   07/16/2015 at Unknown time  . glucosamine-chondroitin 500-400 MG tablet Take 0.5 tablets by mouth at bedtime.   07/16/2015 at Unknown time  . insulin aspart (NOVOLOG) 100 UNIT/ML injection Inject 12.5 Units into the skin 2 (two) times daily.   07/17/2015 at Unknown time  . Insulin Glulisine (APIDRA SOLOSTAR) 100 UNIT/ML Solostar Pen Inject 10-25 Units into the skin 2 (two) times daily. Taken per sliding scale   07/17/2015 at Unknown time  . lansoprazole (PREVACID) 15 MG capsule Take 15 mg by mouth daily at 12 noon.   07/16/2015 at Unknown time  . lisinopril-hydrochlorothiazide (PRINZIDE,ZESTORETIC) 10-12.5 MG per tablet Take 0.5 tablets by mouth at bedtime.   07/16/2015 at Unknown time  . metFORMIN (GLUCOPHAGE) 500 MG tablet Take 1,000 mg by mouth 2 (two) times daily with a meal.   07/16/2015 at Unknown time  . Multiple Vitamin (  MULTIVITAMIN) tablet Take 1 tablet by mouth at bedtime.   07/16/2015 at Unknown time  . pravastatin (PRAVACHOL) 40 MG tablet Take 40 mg by mouth at bedtime.   07/16/2015 at Unknown time    Assessment: Bilateral PE  Goal of Therapy:  Heparin level 0.3-0.7 units/ml Monitor platelets by anticoagulation protocol: Yes   Plan:  HL of 0.36 at 0805.  Per MD, would like HL to be within the upper limit of 0.3-0.7.  Will increase Heparin drip to 1450 units/hr and recheck HL in 6 hours at 1730.  CBC ordered in AM.  Murrell Converse, PharmD Clinical Pharmacist 07/18/2015

## 2015-07-18 NOTE — Progress Notes (Signed)
ANTICOAGULATION CONSULT NOTE - Initial Consult  Pharmacy Consult for Heparin  Indication: pulmonary embolus  No Known Allergies  Patient Measurements: Height: 5\' 5"  (165.1 cm) Weight: 175 lb (79.379 kg) IBW/kg (Calculated) : 61.5 Heparin Dosing Weight:  77.6 kg   Vital Signs: Temp: 98.4 F (36.9 C) (08/01 0000) Temp Source: Oral (08/01 0000) BP: 127/84 mmHg (08/01 0000) Pulse Rate: 97 (08/01 0000)  Labs:  Recent Labs  07/17/15 1239 07/17/15 2149 07/18/15 0031  HGB 13.0  --   --   HCT 41.6  --   --   PLT 335  --   --   HEPARINUNFRC  --   --  0.32  CREATININE 1.32*  --   --   TROPONINI 0.11* 1.70*  --     Estimated Creatinine Clearance: 66.5 mL/min (by C-G formula based on Cr of 1.32).   Medical History: Past Medical History  Diagnosis Date  . Myotonic dystrophy   . Diabetes mellitus without complication     TYPE ONE    Medications:  Prescriptions prior to admission  Medication Sig Dispense Refill Last Dose  . amoxicillin-clavulanate (AUGMENTIN) 875-125 MG per tablet Take 1 tablet by mouth 2 (two) times daily.   07/16/2015 at Unknown time  . aspirin 325 MG tablet Take 162.5 mg by mouth at bedtime.   07/16/2015 at Unknown time  . azelastine (ASTELIN) 0.1 % nasal spray Place 2 sprays into both nostrils 2 (two) times daily as needed for rhinitis. Use in each nostril as directed   Past Week at Unknown time  . b complex vitamins tablet Take 1 tablet by mouth at bedtime.   07/16/2015 at Unknown time  . Cinnamon 500 MG capsule Take 500 mg by mouth every other day.    07/16/2015 at Unknown time  . Coenzyme Q10 (CO Q 10 PO) Take 1 tablet by mouth at bedtime.   07/16/2015 at Unknown time  . fenofibrate (TRICOR) 145 MG tablet Take 145 mg by mouth at bedtime.   07/16/2015 at Unknown time  . Ginger, Zingiber officinalis, 500 MG CAPS Take 1 capsule by mouth every other day.   07/16/2015 at Unknown time  . glucosamine-chondroitin 500-400 MG tablet Take 0.5 tablets by mouth at  bedtime.   07/16/2015 at Unknown time  . insulin aspart (NOVOLOG) 100 UNIT/ML injection Inject 12.5 Units into the skin 2 (two) times daily.   07/17/2015 at Unknown time  . Insulin Glulisine (APIDRA SOLOSTAR) 100 UNIT/ML Solostar Pen Inject 10-25 Units into the skin 2 (two) times daily. Taken per sliding scale   07/17/2015 at Unknown time  . lansoprazole (PREVACID) 15 MG capsule Take 15 mg by mouth daily at 12 noon.   07/16/2015 at Unknown time  . lisinopril-hydrochlorothiazide (PRINZIDE,ZESTORETIC) 10-12.5 MG per tablet Take 0.5 tablets by mouth at bedtime.   07/16/2015 at Unknown time  . metFORMIN (GLUCOPHAGE) 500 MG tablet Take 1,000 mg by mouth 2 (two) times daily with a meal.   07/16/2015 at Unknown time  . Multiple Vitamin (MULTIVITAMIN) tablet Take 1 tablet by mouth at bedtime.   07/16/2015 at Unknown time  . pravastatin (PRAVACHOL) 40 MG tablet Take 40 mg by mouth at bedtime.   07/16/2015 at Unknown time    Assessment: Bilateral PE  Goal of Therapy:  Heparin level 0.3-0.7 units/ml Monitor platelets by anticoagulation protocol: Yes   Plan:  Give 4000 units bolus x 1 Start heparin infusion at 1300 units/hr  Will draw 1st HL 6 hrs after start of drip  on 8/1 @ 00:00   8/1 00:00 anti-Xa 0.32. Recheck in AM to confirm.   Sim Boast, PharmD, BCPS  07/18/2015

## 2015-07-19 DIAGNOSIS — I2699 Other pulmonary embolism without acute cor pulmonale: Secondary | ICD-10-CM

## 2015-07-19 LAB — CBC
HEMATOCRIT: 35.6 % — AB (ref 40.0–52.0)
Hemoglobin: 11.3 g/dL — ABNORMAL LOW (ref 13.0–18.0)
MCH: 27.8 pg (ref 26.0–34.0)
MCHC: 31.7 g/dL — ABNORMAL LOW (ref 32.0–36.0)
MCV: 87.8 fL (ref 80.0–100.0)
Platelets: 306 10*3/uL (ref 150–440)
RBC: 4.06 MIL/uL — ABNORMAL LOW (ref 4.40–5.90)
RDW: 15.9 % — ABNORMAL HIGH (ref 11.5–14.5)
WBC: 8.4 10*3/uL (ref 3.8–10.6)

## 2015-07-19 LAB — AFP TUMOR MARKER: AFP TUMOR MARKER: 1.6 ng/mL (ref 0.0–8.3)

## 2015-07-19 LAB — PROTEIN ELECTROPHORESIS, SERUM
A/G RATIO SPE: 1 (ref 0.7–1.7)
ALBUMIN ELP: 2.8 g/dL — AB (ref 2.9–4.4)
ALPHA-1-GLOBULIN: 0.3 g/dL (ref 0.0–0.4)
Alpha-2-Globulin: 1 g/dL (ref 0.4–1.0)
BETA GLOBULIN: 1.2 g/dL (ref 0.7–1.3)
GLOBULIN, TOTAL: 2.9 g/dL (ref 2.2–3.9)
Gamma Globulin: 0.4 g/dL (ref 0.4–1.8)
M-SPIKE, %: 0.2 g/dL — AB
Total Protein ELP: 5.7 g/dL — ABNORMAL LOW (ref 6.0–8.5)

## 2015-07-19 LAB — GLUCOSE, CAPILLARY
GLUCOSE-CAPILLARY: 107 mg/dL — AB (ref 65–99)
GLUCOSE-CAPILLARY: 110 mg/dL — AB (ref 65–99)
Glucose-Capillary: 196 mg/dL — ABNORMAL HIGH (ref 65–99)
Glucose-Capillary: 205 mg/dL — ABNORMAL HIGH (ref 65–99)
Glucose-Capillary: 227 mg/dL — ABNORMAL HIGH (ref 65–99)

## 2015-07-19 LAB — HEPARIN LEVEL (UNFRACTIONATED)
HEPARIN UNFRACTIONATED: 0.51 [IU]/mL (ref 0.30–0.70)
Heparin Unfractionated: 0.47 IU/mL (ref 0.30–0.70)
Heparin Unfractionated: 0.71 IU/mL — ABNORMAL HIGH (ref 0.30–0.70)

## 2015-07-19 LAB — CANCER ANTIGEN 19-9: CA 19-9: 7 U/mL (ref 0–35)

## 2015-07-19 LAB — CEA

## 2015-07-19 MED ORDER — APIXABAN 5 MG PO TABS
10.0000 mg | ORAL_TABLET | Freq: Two times a day (BID) | ORAL | Status: DC
  Administered 2015-07-20 (×2): 10 mg via ORAL
  Filled 2015-07-19 (×2): qty 2

## 2015-07-19 MED ORDER — SODIUM CHLORIDE 0.9 % IJ SOLN: 3.0000 mL | INTRAMUSCULAR | Status: DC | PRN

## 2015-07-19 MED ORDER — GUAIFENESIN ER 600 MG PO TB12
1200.0000 mg | ORAL_TABLET | Freq: Two times a day (BID) | ORAL | Status: DC | PRN
  Administered 2015-07-19 – 2015-07-23 (×4): 1200 mg via ORAL
  Filled 2015-07-19 (×4): qty 2

## 2015-07-19 NOTE — Progress Notes (Signed)
Subjective:   Improved shortness of breath.  Denies palpitations no fever resting quietly. Patient denies any significant chest pain. Tolerating anticoagulation well  Objective:  Vital Signs in the last 24 hours: Temp:  [97.7 F (36.5 C)-98.6 F (37 C)] 98.3 F (36.8 C) (08/02 1436) Pulse Rate:  [81-170] 100 (08/02 1436) Resp:  [15-25] 18 (08/02 1436) BP: (81-142)/(52-112) 133/80 mmHg (08/02 1436) SpO2:  [85 %-100 %] 99 % (08/02 1436) Weight:  [82.056 kg (180 lb 14.4 oz)] 82.056 kg (180 lb 14.4 oz) (08/02 1436)  Intake/Output from previous day: 08/01 0701 - 08/02 0700 In: 1090 [P.O.:720; I.V.:370] Out: 1650 [Urine:1650] Intake/Output from this shift: Total I/O In: -  Out: 426 [Urine:425; Stool:1]  Physical Exam: General appearance: alert, cooperative and appears stated age Neck: no adenopathy, no carotid bruit, no JVD, supple, symmetrical, trachea midline and thyroid not enlarged, symmetric, no tenderness/mass/nodules Lungs: clear to auscultation bilaterally and normal percussion bilaterally Heart: regular rate and rhythm, S1, S2 normal, no murmur, click, rub or gallop Abdomen: soft, non-tender; bowel sounds normal; no masses,  no organomegaly Extremities: extremities normal, atraumatic, no cyanosis or edema Pulses: 2+ and symmetric Skin: Skin color, texture, turgor normal. No rashes or lesions Neurologic: Motor: grade 4 quadriceps femoris bilaterally  Lab Results:  Recent Labs  07/18/15 0326 07/19/15 0635  WBC 8.8 8.4  HGB 11.7* 11.3*  PLT 323 306    Recent Labs  07/17/15 1239 07/18/15 0326  NA 137 141  K 5.2* 3.8  CL 105 109  CO2 21* 27  GLUCOSE 383* 116*  BUN 23* 18  CREATININE 1.32* 1.02    Recent Labs  07/18/15 0334 07/18/15 0805  TROPONINI 1.94* 1.29*   Hepatic Function Panel  Recent Labs  07/18/15 0326  PROT 6.0*  ALBUMIN 3.2*  AST 73*  ALT 75*  ALKPHOS 38  BILITOT 0.1*   No results for input(s): CHOL in the last 72 hours. No  results for input(s): PROTIME in the last 72 hours.  Imaging: Imaging results have been reviewed  Cardiac Studies:  Assessment/Plan:  Shortness of Breath   elevated troponin/ demand ischemia  pulmonary embolus bilateral  diabetes  hyper lipidemia  myotonic dystrophy . PLAN  supplemental oxygen for shortness of breath  short-term and long-term anticoagulation for DVT/ PE  continue insulin therapy for diabetes  consider physical therapy from myotonic dystrophy  Pravachol for hyperlipidemia  agree with echocardiogram for elevated troponin  do not recommend cardiac catheterization for invasive cardiac studies for what appears to be demand ischemia  agree with vascular for medical therapy and anticoagulation  LOS: 2 days    Bradley Bradley D. 07/19/2015, 3:06 PM

## 2015-07-19 NOTE — Progress Notes (Signed)
ANTICOAGULATION CONSULT NOTE - Follow Up Consult  Pharmacy Consult for Heparin Indication: pulmonary embolus  No Known Allergies  Patient Measurements: Height: 5\' 5"  (165.1 cm) Weight: 175 lb (79.379 kg) IBW/kg (Calculated) : 61.5 Heparin Dosing Weight: 77.6 kg  Vital Signs: Temp: 98.6 F (37 C) (08/02 0000) Temp Source: Oral (08/02 0000) BP: 118/77 mmHg (08/02 0000) Pulse Rate: 81 (08/02 0000)  Labs:  Recent Labs  07/17/15 1239 07/17/15 2149  07/18/15 0326 07/18/15 0334 07/18/15 0805 07/18/15 1717 07/18/15 2327  HGB 13.0  --   --  11.7*  --   --   --   --   HCT 41.6  --   --  37.0*  --   --   --   --   PLT 335  --   --  323  --   --   --   --   LABPROT  --   --   --  14.5  --   --   --   --   INR  --   --   --  1.11  --   --   --   --   HEPARINUNFRC  --   --   < >  --   --  0.36 0.55 0.47  CREATININE 1.32*  --   --  1.02  --   --   --   --   TROPONINI 0.11* 1.70*  --   --  1.94* 1.29*  --   --   < > = values in this interval not displayed.  Estimated Creatinine Clearance: 86.1 mL/min (by C-G formula based on Cr of 1.02).   Medications:  Scheduled:  . antiseptic oral rinse  7 mL Mouth Rinse BID  . aspirin  162.5 mg Oral QHS  . docusate sodium  100 mg Oral BID  . fenofibrate  160 mg Oral Daily  . insulin aspart  0-9 Units Subcutaneous TID WC  . insulin aspart  13 Units Subcutaneous BID  . pravastatin  40 mg Oral QHS  . sodium chloride  3 mL Intravenous Q12H   Infusions:  . heparin 1,450 Units/hr (07/18/15 1900)   PRN: acetaminophen **OR** acetaminophen, azelastine, ondansetron **OR** ondansetron (ZOFRAN) IV, oxyCODONE  Assessment: 49 y/o M with BL PE.   Goal of Therapy:  Heparin level 0.3-0.7 units/ml Monitor platelets by anticoagulation protocol: Yes   Plan:  Per MD, would like HL to be within the upper limit of 0.3-0.7. Will increase heparin drip to 1550 units/hr and recheck HL in 6 h.   Ulice Dash D 07/19/2015,1:20 AM

## 2015-07-19 NOTE — Progress Notes (Signed)
49yo wm transferred from CCU with bil PE.  A&O x3.  No distress on 4LO2 per [place].  Heparin gtt at 15.5mg /hr infusing well lt ac.  Cardiac monitor placed on pt, pt denies chest pain at this time. Lungs diminished lower lobes bil.  Oriented to room and surroundings, POC reviewed with pt and family.  Denies  Need at this time, cb in reach, SR up x2, bed alarm on.

## 2015-07-19 NOTE — Progress Notes (Signed)
[facility] [place] NAME: Bradley Bradley    MR#:  [id number]  DATE OF BIRTH:  Apr 24, 1966  SUBJECTIVE:  CHIEF COMPLAINT:   Chief Complaint  Patient presents with  . Weakness   Shortness of breath, all oxygen and it cannular 4 L REVIEW OF SYSTEMS:  CONSTITUTIONAL: No fever, fatigue or weakness.  EYES: No blurred or double vision.  EARS, NOSE, AND THROAT: No tinnitus or ear pain.  RESPIRATORY: No cough, has shortness of breath, no wheezing or hemoptysis.  CARDIOVASCULAR: No chest pain, orthopnea, edema.  GASTROINTESTINAL: No nausea, vomiting, diarrhea or abdominal pain.  GENITOURINARY: No dysuria, hematuria.  ENDOCRINE: No polyuria, nocturia,  HEMATOLOGY: No anemia, easy bruising or bleeding SKIN: No rash or lesion. MUSCULOSKELETAL: No joint pain or arthritis.   NEUROLOGIC: No tingling, numbness, weakness.  PSYCHIATRY: No anxiety or depression.   DRUG ALLERGIES:  No Known Allergies  VITALS:  Blood pressure 138/69, pulse 170, temperature 97.9 F (36.6 C), temperature source Oral, resp. rate 25, height 5\' 5"  (1.651 m), weight 79.379 kg (175 lb), SpO2 100 %.  PHYSICAL EXAMINATION:  GENERAL:  49 y.o.-year-old patient lying in the bed with no acute distress.  EYES: Pupils equal, round, reactive to light and accommodation. No scleral icterus. Extraocular muscles intact.  HEENT: Head atraumatic, normocephalic. Oropharynx and nasopharynx clear.  NECK:  Supple, no jugular venous distention. No thyroid enlargement, no tenderness.  LUNGS: Normal breath sounds bilaterally, no wheezing, rales,rhonchi or crepitation. No use of accessory muscles of respiration.  CARDIOVASCULAR: S1, S2 normal. No murmurs, rubs, or gallops.  ABDOMEN: Soft, nontender, nondistended. Bowel sounds present. No organomegaly or mass.  EXTREMITIES: No pedal edema, cyanosis, or clubbing.  NEUROLOGIC: Cranial nerves II through XII are intact. Muscle strength  5/5 in all extremities. Sensation intact. Gait not checked.  PSYCHIATRIC: The patient is alert and oriented x 3.  SKIN: No obvious rash, lesion, or ulcer.    LABORATORY PANEL:   CBC  Recent Labs Lab 07/19/15 0635  WBC 8.4  HGB 11.3*  HCT 35.6*  PLT 306   ------------------------------------------------------------------------------------------------------------------  Chemistries   Recent Labs Lab 07/18/15 0326  NA 141  K 3.8  CL 109  CO2 27  GLUCOSE 116*  BUN 18  CREATININE 1.02  CALCIUM 8.7*  AST 73*  ALT 75*  ALKPHOS 38  BILITOT 0.1*   ------------------------------------------------------------------------------------------------------------------  Cardiac Enzymes  Recent Labs Lab 07/18/15 0805  TROPONINI 1.29*   ------------------------------------------------------------------------------------------------------------------  RADIOLOGY:  Ct Angio Chest Pe W/cm &/or Wo Cm  07/17/2015   CLINICAL DATA:  Myotonic dystrophy. Weakness. Dizziness. Nausea and hyperglycemia. Initial encounter. Pulmonary embolism.  EXAM: CT ANGIOGRAPHY CHEST WITH CONTRAST  TECHNIQUE: Multidetector CT imaging of the chest was performed using the standard protocol during bolus administration of intravenous contrast. Multiplanar CT image reconstructions and MIPs were obtained to evaluate the vascular anatomy.  CONTRAST:  93mL OMNIPAQUE IOHEXOL 350 MG/ML SOLN  COMPARISON:  None.  FINDINGS: Bones: No aggressive osseous lesions. dextroconvex thoracic scoliosis. Scattered Schmorl's nodes throughout the thoracic spine. 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 paraspinal muscular atrophy, compatible with myotonic dystrophy.  Cardiovascular: Large bilateral pulmonary emboli are present, straddling the bifurcation of the LEFT and RIGHT pulmonary arteries. Embolus is present in all lobes of both lungs. There is also RIGHT heart strain. The LEFT ventricle measures 26 mm and the  RIGHT ventricle measures 43 mm.  Lungs: Basilar atelectasis. Small focus of ground-glass attenuation in the anterior RIGHT upper lobe  potentially represents pulmonary infarction.  Central airways: Patent.  Effusions: None.  Lymphadenopathy: No axillary adenopathy. No mediastinal or hilar adenopathy.  Esophagus: Normal.  Upper abdomen: Either fat containing subxiphoid hernia or a laxity of the anterior abdominal wall in the subxiphoid region. This area is only partially visualized. No gross acute abnormality in the upper abdomen.  Other: None.  Review of the MIP images confirms the above findings.  IMPRESSION: 1. Bilateral pulmonary emboli. Positive for acute PE with CT evidence of right heart strain (RV/LV Ratio = 1.65) consistent with at least submassive (intermediate risk) PE. The presence of right heart strain has been associated with an increased risk of morbidity and mortality. Please activate Code PE by paging [phone]. 2. Probable small pulmonary infarct in the peripheral RIGHT upper lobe. 3. Low volumes with basilar atelectasis. 4. 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 5. Critical Value/emergent results were called by telephone at the time of interpretation on 07/17/2015 at 5:19 pm to charge nurse Romie Minus, who verbally acknowledged these results.   Electronically Signed   By: Dereck Ligas M.D.   On: 07/17/2015 17:18   US Venous Img Lower Bilateral  07/18/2015   CLINICAL DATA:  Diabetes.  Pulmonary embolus.  EXAM: BILATERAL LOWER EXTREMITY VENOUS DOPPLER ULTRASOUND  TECHNIQUE: Gray-scale sonography with graded compression, as well as color Doppler and duplex ultrasound were performed to evaluate the lower extremity deep venous systems from the level of the common femoral vein and including the common femoral, femoral, profunda femoral, popliteal and calf veins including the posterior tibial, peroneal and gastrocnemius veins when visible. The superficial  great saphenous vein was also interrogated. Spectral Doppler was utilized to evaluate flow at rest and with distal augmentation maneuvers in the common femoral, femoral and popliteal veins.  COMPARISON:  CT 07/17/2015.  FINDINGS: RIGHT LOWER EXTREMITY  Common Femoral Vein: No evidence of thrombus. Normal compressibility, respiratory phasicity and response to augmentation.  Saphenofemoral Junction: No evidence of thrombus. Normal compressibility and flow on color Doppler imaging.  Profunda Femoral Vein: No evidence of thrombus. Normal compressibility and flow on color Doppler imaging.  Femoral Vein: No evidence of thrombus. Normal compressibility, respiratory phasicity and response to augmentation.  Popliteal Vein: No evidence of thrombus. Normal compressibility, respiratory phasicity and response to augmentation.  Calf Veins: No evidence of thrombus. Normal compressibility and flow on color Doppler imaging.  Superficial Great Saphenous Vein: No evidence of thrombus. Normal compressibility and flow on color Doppler imaging.  Venous Reflux:  None.  Other Findings:  None.  LEFT LOWER EXTREMITY  Common Femoral Vein: No evidence of thrombus. Normal compressibility, respiratory phasicity and response to augmentation.  Saphenofemoral Junction: No evidence of thrombus. Normal compressibility and flow on color Doppler imaging.  Profunda Femoral Vein: No evidence of thrombus. Normal compressibility and flow on color Doppler imaging.  Femoral Vein: Thrombosis noted throughout the left superficial femoral vein. The vein is noncompressible.  Popliteal Vein: Thrombus is noted within the left popliteal vein. The left popliteal vein is noncompressible.  Calf Veins: Thrombus is noted in the posterior tibial vein. The posterior tibial vein is noncompressible.  Superficial Great Saphenous Vein: No evidence of thrombus. Normal compressibility and flow on color Doppler imaging.  Venous Reflux:  None.  Other Findings:  None.  IMPRESSION:  Left lower extremity deep venous thrombosis. Right lower extremity unremarkable.   Electronically Signed   By: Marcello Moores  Register   On: 07/18/2015 10:57    EKG:   Orders placed  or performed during the hospital encounter of 07/17/15  . EKG 12-Lead  . EKG 12-Lead    ASSESSMENT AND PLAN:   1. Acute respiratory failure with hypoxia secondary to bilateral submassive pulmonary embolism with right heart strain.  * bilateral submassive pulmonary embolism * Left leg DVT. * moderate pulmonary hypertension   continue heparin drip, change to eliquis/xarelto in 1-2 days per Dr. Mortimer Fries.  Bilateral lower extremity venous Dopplers shows left leg DVT.   2. Elevated troponin with right heart strain, Continue heparin drip, echo EF 60-65%, moderate pulmonary hypertension   3. 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 Hyperkalemia  Improved after Kayexalate 1    All the records are reviewed and case discussed with Care Management/Social Workerr. Management plans discussed with the patient, his wife and they are in agreement.  CODE STATUS: Full code  TOTAL TIME TAKING CARE OF THIS PATIENT: 38 minutes.   POSSIBLE D/C IN 2-3 DAYS, DEPENDING ON CLINICAL CONDITION.   Demetrios Loll M.D on 07/19/2015 at 1:44 PM  Between 7am to 6pm - Pager - [phone]  After 6pm go to [email or web address] - password [email or web address] [facility]  Office  [phone]  CC: Primary care physician; Dion Body, MD

## 2015-07-19 NOTE — Consult Note (Signed)
Reason for Consult:Elevated troponin,  pulmonary embolus Referring Physician:  Dr. Bridgett Larsson hospitalist, Dr. Sunday Spillers Bradley Bradley is an 49 y.o. male.  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 Patient denies any previous coagulopathy no previous DVT or pulmonary embolus. And he was treated with anticoagulation as well as supplemental oxygen. Patient had tachycardia was found have elevated troponins a cardiology consultation would recommend. Patient has a history of diabetes is being treated treated with insulin. Patient has improve shortness of breath today since oxygen and anticoagulation. Vascular did not recommend filter or thrombectomy.  Past Medical History  Diagnosis Date  . Myotonic dystrophy   . Diabetes mellitus without complication     TYPE ONE    Past Surgical History  Procedure Laterality Date  . Joint replacement    . Eye surgery    . Dental surgery      Family History  Problem Relation Age of Onset  . Pulmonary embolism Brother     Social History:  reports that he has quit smoking. He does not have any smokeless tobacco history on file. He reports that he drinks alcohol. His drug history is not on file.  Allergies: No Known Allergies  Medications: I have reviewed the patient's current medications.  Results for orders placed or performed during the hospital encounter of 07/17/15 (from the past 48 hour(s))  Fibrin derivatives D-Dimer ([address] only)     Status: Abnormal   Collection Time: 07/17/15 12:28 PM  Result Value Ref Range   Fibrin derivatives D-dimer ([facility]) 7689.92 (H) 0 - 499    Comment: <> Exclusion of Venous Thromboembolism (VTE) - OUTPATIENTS ONLY        (Emergency Department or  [place])             0-499 ng/ml (FEU)  : With a low to intermediate pretest                                        probability for VTE this test result                                        excludes the diagnosis of VTE.           > 499 ng/ml (FEU)  : VTE not excluded.  Additional work up                                   for VTE is required.   <>  Testing on Inpatients and Evaluation of Disseminated Intravascular        Coagulation ([facility])             Reference Range:   0-499 ng/ml (FEU)   Glucose, capillary     Status: Abnormal   Collection Time: 07/17/15 12:39 PM  Result Value Ref Range   Glucose-Capillary 378 (H) 65 - 99 mg/dL  CBC     Status: Abnormal   Collection Time: 07/17/15 12:39 PM  Result Value Ref Range  WBC 8.9 3.8 - 10.6 K/uL   RBC 4.71 4.40 - 5.90 MIL/uL   Hemoglobin 13.0 13.0 - 18.0 g/dL   HCT 41.6 40.0 - 52.0 %   MCV 88.4 80.0 - 100.0 fL   MCH 27.7 26.0 - 34.0 pg   MCHC 31.3 (L) 32.0 - 36.0 g/dL   RDW 15.7 (H) 11.5 - 14.5 %   Platelets 335 150 - 440 K/uL  Comprehensive metabolic panel     Status: Abnormal   Collection Time: 07/17/15 12:39 PM  Result Value Ref Range   Sodium 137 135 - 145 mmol/L   Potassium 5.2 (H) 3.5 - 5.1 mmol/L    Comment: HEMOLYSIS AT THIS LEVEL MAY AFFECT RESULT   Chloride 105 101 - 111 mmol/L   CO2 21 (L) 22 - 32 mmol/L   Glucose, Bld 383 (H) 65 - 99 mg/dL   BUN 23 (H) 6 - 20 mg/dL   Creatinine, Ser 1.32 (H) 0.61 - 1.24 mg/dL   Calcium 9.5 8.9 - 10.3 mg/dL   Total Protein 6.7 6.5 - 8.1 g/dL   Albumin 3.5 3.5 - 5.0 g/dL   AST 108 (H) 15 - 41 U/L   ALT 72 (H) 17 - 63 U/L   Alkaline Phosphatase 43 38 - 126 U/L   Total Bilirubin 1.0 0.3 - 1.2 mg/dL   GFR calc non Af Amer >60 >60 mL/min   GFR calc Af Amer >60 >60 mL/min    Comment: (NOTE) The eGFR has been calculated using the CKD EPI equation. This calculation has not been validated in all clinical situations. eGFR's persistently <60 mL/min signify possible Chronic  Kidney Disease.    Anion gap 11 5 - 15  Troponin I     Status: Abnormal   Collection Time: 07/17/15 12:39 PM  Result Value Ref Range   Troponin I 0.11 (H) <0.031 ng/mL    Comment: READ BACK AND VERIFIED WITH SUSAN NEAL @ 7353 ON 07/17/2015 CAF        PERSISTENTLY INCREASED TROPONIN VALUES IN THE RANGE OF 0.04-0.49 ng/mL CAN BE SEEN IN:       -UNSTABLE ANGINA       -CONGESTIVE HEART FAILURE       -MYOCARDITIS       -CHEST TRAUMA       -ARRYHTHMIAS       -LATE PRESENTING MYOCARDIAL INFARCTION       -COPD   CLINICAL FOLLOW-UP RECOMMENDED.   Urinalysis complete, with microscopic ([facility] only)     Status: Abnormal   Collection Time: 07/17/15 12:40 PM  Result Value Ref Range   Color, Urine YELLOW (A) YELLOW   APPearance CLEAR (A) CLEAR   Glucose, UA 50 (A) NEGATIVE mg/dL   Bilirubin Urine NEGATIVE NEGATIVE   Ketones, ur NEGATIVE NEGATIVE mg/dL   Specific Gravity, Urine >1.060 (H) 1.005 - 1.030   Hgb urine dipstick NEGATIVE NEGATIVE   pH 5.0 5.0 - 8.0   Protein, ur NEGATIVE NEGATIVE mg/dL   Nitrite NEGATIVE NEGATIVE   Leukocytes, UA NEGATIVE NEGATIVE   RBC / HPF 0-5 0 - 5 RBC/hpf   WBC, UA 0-5 0 - 5 WBC/hpf   Bacteria, UA NONE SEEN NONE SEEN   Squamous Epithelial / LPF NONE SEEN NONE SEEN   Mucous PRESENT    Hyaline Casts, UA PRESENT   Lactic acid, plasma     Status: Abnormal   Collection Time: 07/17/15  1:11 PM  Result Value Ref Range   Lactic Acid, Venous 3.1 (HH)  0.5 - 2.0 mmol/L    Comment: CRITICAL RESULT CALLED TO, READ BACK BY AND VERIFIED WITH SUSAL NEAL @ 3833 ON 07/17/2015 CAF RESULTS VERIFIED BY REPEAT TESTING   Blood culture (routine x 2)     Status: None (Preliminary result)   Collection Time: 07/17/15  2:50 PM  Result Value Ref Range   Specimen Description BLOOD RIGHT WRIST    Special Requests BAA,5MLAER,5MLANA    Culture NO GROWTH 2 DAYS    Report Status PENDING   Blood culture (routine x 2)     Status: None (Preliminary result)   Collection Time:  07/17/15  3:05 PM  Result Value Ref Range   Specimen Description BLOOD LEFT WRIST    Special Requests BAA,5MLAER,5MLANA    Culture NO GROWTH 2 DAYS    Report Status PENDING   MRSA PCR Screening     Status: None   Collection Time: 07/17/15  9:27 PM  Result Value Ref Range   MRSA by PCR NEGATIVE NEGATIVE    Comment:        The GeneXpert MRSA Assay (FDA approved for NASAL specimens only), is one component of a comprehensive MRSA colonization surveillance program. It is not intended to diagnose MRSA infection nor to guide or monitor treatment for MRSA infections.   Troponin I     Status: Abnormal   Collection Time: 07/17/15  9:49 PM  Result Value Ref Range   Troponin I 1.70 (H) <0.031 ng/mL    Comment: READ BACK AND VERIFIED WITH JULIE BRADDY AT 2344 07/17/15.PMH        POSSIBLE MYOCARDIAL ISCHEMIA. SERIAL TESTING RECOMMENDED.   Glucose, capillary     Status: Abnormal   Collection Time: 07/17/15 10:04 PM  Result Value Ref Range   Glucose-Capillary 167 (H) 65 - 99 mg/dL  Heparin level (unfractionated)     Status: None   Collection Time: 07/18/15 12:31 AM  Result Value Ref Range   Heparin Unfractionated 0.32 0.30 - 0.70 IU/mL    Comment:        IF HEPARIN RESULTS ARE BELOW EXPECTED VALUES, AND PATIENT DOSAGE HAS BEEN CONFIRMED, SUGGEST FOLLOW UP TESTING OF ANTITHROMBIN III LEVELS.   CBC     Status: Abnormal   Collection Time: 07/18/15  3:26 AM  Result Value Ref Range   WBC 8.8 3.8 - 10.6 K/uL   RBC 4.23 (L) 4.40 - 5.90 MIL/uL   Hemoglobin 11.7 (L) 13.0 - 18.0 g/dL   HCT 37.0 (L) 40.0 - 52.0 %   MCV 87.4 80.0 - 100.0 fL   MCH 27.6 26.0 - 34.0 pg   MCHC 31.6 (L) 32.0 - 36.0 g/dL   RDW 15.8 (H) 11.5 - 14.5 %   Platelets 323 150 - 440 K/uL  Comprehensive metabolic panel     Status: Abnormal   Collection Time: 07/18/15  3:26 AM  Result Value Ref Range   Sodium 141 135 - 145 mmol/L   Potassium 3.8 3.5 - 5.1 mmol/L   Chloride 109 101 - 111 mmol/L   CO2 27 22 - 32  mmol/L   Glucose, Bld 116 (H) 65 - 99 mg/dL   BUN 18 6 - 20 mg/dL   Creatinine, Ser 1.02 0.61 - 1.24 mg/dL   Calcium 8.7 (L) 8.9 - 10.3 mg/dL   Total Protein 6.0 (L) 6.5 - 8.1 g/dL   Albumin 3.2 (L) 3.5 - 5.0 g/dL   AST 73 (H) 15 - 41 U/L   ALT 75 (H) 17 - 63 U/L  Alkaline Phosphatase 38 38 - 126 U/L   Total Bilirubin 0.1 (L) 0.3 - 1.2 mg/dL   GFR calc non Af Amer >60 >60 mL/min   GFR calc Af Amer >60 >60 mL/min    Comment: (NOTE) The eGFR has been calculated using the CKD EPI equation. This calculation has not been validated in all clinical situations. eGFR's persistently <60 mL/min signify possible Chronic Kidney Disease.    Anion gap 5 5 - 15  Protime-INR     Status: None   Collection Time: 07/18/15  3:26 AM  Result Value Ref Range   Prothrombin Time 14.5 11.4 - 15.0 seconds   INR 1.11   Troponin I     Status: Abnormal   Collection Time: 07/18/15  3:34 AM  Result Value Ref Range   Troponin I 1.94 (H) <0.031 ng/mL    Comment: RESULTS PREVIOUSLY CALLED AT 2344 07/17/15.PMH        POSSIBLE MYOCARDIAL ISCHEMIA. SERIAL TESTING RECOMMENDED.   Glucose, capillary     Status: Abnormal   Collection Time: 07/18/15  7:32 AM  Result Value Ref Range   Glucose-Capillary 173 (H) 65 - 99 mg/dL  Troponin I     Status: Abnormal   Collection Time: 07/18/15  8:05 AM  Result Value Ref Range   Troponin I 1.29 (H) <0.031 ng/mL    Comment: RESULTS PREVIOUSLY CALLED AT 2344 ON 07/17/15        POSSIBLE MYOCARDIAL ISCHEMIA. SERIAL TESTING RECOMMENDED.   Heparin level (unfractionated)     Status: None   Collection Time: 07/18/15  8:05 AM  Result Value Ref Range   Heparin Unfractionated 0.36 0.30 - 0.70 IU/mL    Comment:        IF HEPARIN RESULTS ARE BELOW EXPECTED VALUES, AND PATIENT DOSAGE HAS BEEN CONFIRMED, SUGGEST FOLLOW UP TESTING OF ANTITHROMBIN III LEVELS.   Glucose, capillary     Status: Abnormal   Collection Time: 07/18/15 11:36 AM  Result Value Ref Range    Glucose-Capillary 153 (H) 65 - 99 mg/dL  CEA     Status: None   Collection Time: 07/18/15  2:10 PM  Result Value Ref Range   CEA <0.3 0.0 - 4.7 ng/mL    Comment: (NOTE)       Roche ECLIA methodology       Nonsmokers  <3.9                                     Smokers     <5.6 Performed At: [facility] [place], [place] [id number] Lindon Romp MD [id number]   Cancer antigen 19-9     Status: None   Collection Time: 07/18/15  2:10 PM  Result Value Ref Range   CA 19-9 7 0 - 35 U/mL    Comment: (NOTE) Roche ECLIA methodology Performed At: [facility] [place], [place] [id number] Lindon Romp MD PY:[id number]   AFP tumor marker     Status: None   Collection Time: 07/18/15  2:10 PM  Result Value Ref Range   AFP-Tumor Marker 1.6 0.0 - 8.3 ng/mL    Comment: (NOTE) Roche ECLIA methodology Performed At: [facility] [address] [place], [place] [id number] Lindon Romp MD JA:[id number]   Glucose, capillary     Status: Abnormal   Collection Time: 07/18/15  4:23 PM  Result Value Ref Range   Glucose-Capillary  198 (H) 65 - 99 mg/dL  Heparin level (unfractionated)     Status: None   Collection Time: 07/18/15  5:17 PM  Result Value Ref Range   Heparin Unfractionated 0.55 0.30 - 0.70 IU/mL    Comment:        IF HEPARIN RESULTS ARE BELOW EXPECTED VALUES, AND PATIENT DOSAGE HAS BEEN CONFIRMED, SUGGEST FOLLOW UP TESTING OF ANTITHROMBIN III LEVELS.   Glucose, capillary     Status: Abnormal   Collection Time: 07/18/15 10:20 PM  Result Value Ref Range   Glucose-Capillary 196 (H) 65 - 99 mg/dL  Heparin level (unfractionated)     Status: None   Collection Time: 07/18/15 11:27 PM  Result Value Ref Range   Heparin Unfractionated 0.47 0.30 - 0.70 IU/mL    Comment:        IF HEPARIN RESULTS ARE BELOW EXPECTED VALUES, AND PATIENT DOSAGE HAS BEEN CONFIRMED, SUGGEST FOLLOW UP TESTING OF ANTITHROMBIN III LEVELS.   CBC      Status: Abnormal   Collection Time: 07/19/15  6:35 AM  Result Value Ref Range   WBC 8.4 3.8 - 10.6 K/uL   RBC 4.06 (L) 4.40 - 5.90 MIL/uL   Hemoglobin 11.3 (L) 13.0 - 18.0 g/dL   HCT 35.6 (L) 40.0 - 52.0 %   MCV 87.8 80.0 - 100.0 fL   MCH 27.8 26.0 - 34.0 pg   MCHC 31.7 (L) 32.0 - 36.0 g/dL   RDW 15.9 (H) 11.5 - 14.5 %   Platelets 306 150 - 440 K/uL  Heparin level (unfractionated)     Status: Abnormal   Collection Time: 07/19/15  6:35 AM  Result Value Ref Range   Heparin Unfractionated 0.71 (H) 0.30 - 0.70 IU/mL    Comment:        IF HEPARIN RESULTS ARE BELOW EXPECTED VALUES, AND PATIENT DOSAGE HAS BEEN CONFIRMED, SUGGEST FOLLOW UP TESTING OF ANTITHROMBIN III LEVELS.   Glucose, capillary     Status: Abnormal   Collection Time: 07/19/15  7:15 AM  Result Value Ref Range   Glucose-Capillary 107 (H) 65 - 99 mg/dL    Ct Angio Chest Pe W/cm &/or Wo Cm  07/17/2015   CLINICAL DATA:  Myotonic dystrophy. Weakness. Dizziness. Nausea and hyperglycemia. Initial encounter. Pulmonary embolism.  EXAM: CT ANGIOGRAPHY CHEST WITH CONTRAST  TECHNIQUE: Multidetector CT imaging of the chest was performed using the standard protocol during bolus administration of intravenous contrast. Multiplanar CT image reconstructions and MIPs were obtained to evaluate the vascular anatomy.  CONTRAST:  25m OMNIPAQUE IOHEXOL 350 MG/ML SOLN  COMPARISON:  None.  FINDINGS: Bones: No aggressive osseous lesions. dextroconvex thoracic scoliosis. Scattered Schmorl's nodes throughout the thoracic spine. 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 paraspinal muscular atrophy, compatible with myotonic dystrophy.  Cardiovascular: Large bilateral pulmonary emboli are present, straddling the bifurcation of the LEFT and RIGHT pulmonary arteries. Embolus is present in all lobes of both lungs. There is also RIGHT heart strain. The LEFT ventricle measures 26 mm and the RIGHT ventricle measures 43 mm.  Lungs: Basilar  atelectasis. Small focus of ground-glass attenuation in the anterior RIGHT upper lobe potentially represents pulmonary infarction.  Central airways: Patent.  Effusions: None.  Lymphadenopathy: No axillary adenopathy. No mediastinal or hilar adenopathy.  Esophagus: Normal.  Upper abdomen: Either fat containing subxiphoid hernia or a laxity of the anterior abdominal wall in the subxiphoid region. This area is only partially visualized. No gross acute abnormality in the upper abdomen.  Other: None.  Review of the  MIP images confirms the above findings.  IMPRESSION: 1. Bilateral pulmonary emboli. Positive for acute PE with CT evidence of right heart strain (RV/LV Ratio = 1.65) consistent with at least submassive (intermediate risk) PE. The presence of right heart strain has been associated with an increased risk of morbidity and mortality. Please activate Code PE by paging [phone]. 2. Probable small pulmonary infarct in the peripheral RIGHT upper lobe. 3. Low volumes with basilar atelectasis. 4. 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 5. Critical Value/emergent results were called by telephone at the time of interpretation on 07/17/2015 at 5:19 pm to charge nurse Romie Minus, who verbally acknowledged these results.   Electronically Signed   By: Dereck Ligas M.D.   On: 07/17/2015 17:18   US Venous Img Lower Bilateral  07/18/2015   CLINICAL DATA:  Diabetes.  Pulmonary embolus.  EXAM: BILATERAL LOWER EXTREMITY VENOUS DOPPLER ULTRASOUND  TECHNIQUE: Gray-scale sonography with graded compression, as well as color Doppler and duplex ultrasound were performed to evaluate the lower extremity deep venous systems from the level of the common femoral vein and including the common femoral, femoral, profunda femoral, popliteal and calf veins including the posterior tibial, peroneal and gastrocnemius veins when visible. The superficial great saphenous vein was also interrogated.  Spectral Doppler was utilized to evaluate flow at rest and with distal augmentation maneuvers in the common femoral, femoral and popliteal veins.  COMPARISON:  CT 07/17/2015.  FINDINGS: RIGHT LOWER EXTREMITY  Common Femoral Vein: No evidence of thrombus. Normal compressibility, respiratory phasicity and response to augmentation.  Saphenofemoral Junction: No evidence of thrombus. Normal compressibility and flow on color Doppler imaging.  Profunda Femoral Vein: No evidence of thrombus. Normal compressibility and flow on color Doppler imaging.  Femoral Vein: No evidence of thrombus. Normal compressibility, respiratory phasicity and response to augmentation.  Popliteal Vein: No evidence of thrombus. Normal compressibility, respiratory phasicity and response to augmentation.  Calf Veins: No evidence of thrombus. Normal compressibility and flow on color Doppler imaging.  Superficial Great Saphenous Vein: No evidence of thrombus. Normal compressibility and flow on color Doppler imaging.  Venous Reflux:  None.  Other Findings:  None.  LEFT LOWER EXTREMITY  Common Femoral Vein: No evidence of thrombus. Normal compressibility, respiratory phasicity and response to augmentation.  Saphenofemoral Junction: No evidence of thrombus. Normal compressibility and flow on color Doppler imaging.  Profunda Femoral Vein: No evidence of thrombus. Normal compressibility and flow on color Doppler imaging.  Femoral Vein: Thrombosis noted throughout the left superficial femoral vein. The vein is noncompressible.  Popliteal Vein: Thrombus is noted within the left popliteal vein. The left popliteal vein is noncompressible.  Calf Veins: Thrombus is noted in the posterior tibial vein. The posterior tibial vein is noncompressible.  Superficial Great Saphenous Vein: No evidence of thrombus. Normal compressibility and flow on color Doppler imaging.  Venous Reflux:  None.  Other Findings:  None.  IMPRESSION: Left lower extremity deep venous  thrombosis. Right lower extremity unremarkable.   Electronically Signed   By: Marcello Moores  Register   On: 07/18/2015 10:57   Dg Chest Portable 1 View  07/17/2015   CLINICAL DATA:  Weakness, nonsmoker.  EXAM: PORTABLE CHEST - 1 VIEW  COMPARISON:  None.  FINDINGS: Cardiac silhouette is enlarged. There are low lung volumes. There is mild linear atelectasis at the lung bases. No pulmonary edema or infiltrate.  IMPRESSION: Low lung volumes and bibasilar atelectasis.   Electronically Signed   By: Helane Gunther.D.  On: 07/17/2015 13:30    Review of Systems  Constitutional: Positive for malaise/fatigue.  HENT: Positive for congestion.   Eyes: Negative.   Respiratory: Positive for shortness of breath.   Cardiovascular: Positive for palpitations and orthopnea.  Genitourinary: Positive for dysuria and frequency.  Musculoskeletal: Positive for myalgias, back pain and joint pain.  Skin: Negative.   Neurological: Positive for weakness.  Endo/Heme/Allergies: Negative.   Psychiatric/Behavioral: Negative.    Blood pressure 121/76, pulse 90, temperature 97.9 F (36.6 C), temperature source Oral, resp. rate 19, height _0  (1.651 m), weight 79.379 kg (175 lb), SpO2 89 %. Physical Exam  Constitutional: He is oriented to person, place, and time. He appears well-developed and well-nourished.  HENT:  Head: Normocephalic and atraumatic.  Right Ear: External ear normal.  Eyes: Conjunctivae and EOM are normal. Pupils are equal, round, and reactive to light.  Neck: Normal range of motion. Neck supple.  Cardiovascular: Normal rate, regular rhythm and normal heart sounds.   Respiratory: He is in respiratory distress.  GI: Soft. Bowel sounds are normal.  Musculoskeletal: Normal range of motion. He exhibits tenderness.       Right shoulder: He exhibits pain, spasm and decreased strength.  Neurological: He is alert and oriented to person, place, and time.  Skin: Skin is warm and dry.  Psychiatric: He has a  normal mood and affect. His behavior is normal.    Assessment/Plan:  pulmonary embolus  elevated troponin/  probably demand ischemia  diabetes  hyperlipidemia  shortness of breath  myotonic dystrophy . PLAN  oxygen supplementation  anticoagulation short-term and long-term  consider IVC filter  echocardiogram for elevated troponin  follow-up EKG  continue Pravachol for hyperlipidemia  agree with insulin therapy for diabetes  recommend support stockings long-term  would probably recommend anticoagulation long-term  elevated troponin probably represents demand ischemia  do not recommend further direct cardiac evaluation  Maxtyn Nuzum D. 07/19/2015, 9:26 AM

## 2015-07-19 NOTE — Consult Note (Signed)
PULMONARY/CCM CONSULT NOTE  Requesting MD/Service: Internal Medicine Date of admission: 7/31 Date of consult: 8/01 Reason for consultation: PE   HPI:  Patient feels better today, less SOB Plan to d/c foley, out of bed to chair as tolerated,   Past Medical History  Diagnosis Date  . Myotonic dystrophy   . 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 Marital Status: Married    Spouse Name: N/A  . Number of Children: N/A  . Years of Education: N/A   Occupational History  . Not on file.   Social History Main Topics  . Smoking status: Former Research scientist (life sciences)  . Smokeless tobacco: Not on file  . Alcohol Use: Yes     Comment: occasionally  . Drug Use: Not on file  . Sexual Activity: Not on file   Other Topics Concern  . Not on file   Social History Narrative    Family History  Problem Relation Age of Onset  . Pulmonary embolism Brother     ROS - Review of Systems  Constitutional: Positive for malaise/fatigue. Negative for fever, chills and weight loss.  HENT: Negative.   Eyes: Negative.   Respiratory: Positive for shortness of breath.   Cardiovascular: Negative.   Gastrointestinal: Negative.   Genitourinary: Negative.   Musculoskeletal: Negative.   Skin: Negative.  Negative for itching and rash.  Neurological: Positive for dizziness and weakness.  Endo/Heme/Allergies: Negative.   Psychiatric/Behavioral: Negative.      Filed Vitals:   07/19/15 0800 07/19/15 0900 07/19/15 1000 07/19/15 1100  BP: 123/77 142/87 138/69   Pulse: 82 89 111 170  Temp:      TempSrc:      Resp: 15 16 22 25   Height:      Weight:      SpO2: 85% 99% 97% 100%   Physical Examination:   VS: BP 138/69 mmHg  Pulse 170  Temp(Src) 97.9 F (36.6 C) (Oral)  Resp 25  Ht 5\' 5"  (1.651 m)  Wt 175 lb (79.379 kg)  BMI 29.12 kg/m2  SpO2 100%  General Appearance: No distress  Neuro:without focal findings, mental status, speech normal,  alert and oriented, cranial nerves 2-12 intact, reflexes normal and symmetric, sensation grossly normal  HEENT: PERRLA, EOM intact, no ptosis, no other lesions noticed;  Pulmonary: normal breath sounds., diaphragmatic excursion normal.No wheezing, No rales;      CardiovascularNormal S1,S2.  No m/r/g.  Abdominal aorta pulsation normal.    Abdomen: Benign, Soft, non-tender, No masses, hepatosplenomegaly, No lymphadenopathy Renal:  No costovertebral tenderness  GU:  No performed at this time. Endoc: No evident thyromegaly, no signs of acromegaly or Cushing features Skin:   warm, no rashes, no ecchymosis  Extremities: normal, no cyanosis, clubbing, no edema, warm with normal capillary refill.   DATA:  BMET    Component Value Date/Time   NA 141 07/18/2015 0326   NA 141 01/21/2012 0623   K 3.8 07/18/2015 0326   K 4.6 01/21/2012 0623   CL 109 07/18/2015 0326   CL 106 01/21/2012 0623   CO2 27 07/18/2015 0326   CO2 27 01/21/2012 0623   GLUCOSE 116* 07/18/2015 0326   GLUCOSE 191* 01/21/2012 0623   BUN 18 07/18/2015 0326   BUN 23* 01/21/2012 0623   CREATININE 1.02 07/18/2015 0326   CREATININE 0.89 01/21/2012 0623   CALCIUM 8.7* 07/18/2015 0326   CALCIUM 9.3 01/21/2012 0623   GFRNONAA >60  07/18/2015 0326   GFRAA >60 07/18/2015 0326    CBC    Component Value Date/Time   WBC 8.4 07/19/2015 0635   WBC 8.4 01/21/2012 0623   RBC 4.06* 07/19/2015 0635   RBC 4.45 01/21/2012 0623   HGB 11.3* 07/19/2015 0635   HGB 13.1 01/21/2012 0623   HCT 35.6* 07/19/2015 0635   HCT 40.1 01/21/2012 0623   PLT 306 07/19/2015 0635   PLT 226 01/21/2012 0623   MCV 87.8 07/19/2015 0635   MCV 90 01/21/2012 0623   MCH 27.8 07/19/2015 0635   MCH 29.5 01/21/2012 0623   MCHC 31.7* 07/19/2015 0635   MCHC 32.7 01/21/2012 0623   RDW 15.9* 07/19/2015 0635   RDW 14.4 01/21/2012 0623   CXR: mild bibasilar atelectasis CT chest: large bilateral PE. Dilated RV EKG: No S1Q3T3 pattern. NSST changes LE venous  Doppler scans: small residual DVT on L  IMPRESSION:   Large bilateral PEs -   PLAN/REC:  1) continue UFH per pharmacy dosing for now. instructed to try to keep him on the high side of the therapeutic range.  2) Begin oral anticoagulation Rivaroxaban (Xarelto) or elliqious   in the next day or two. Plan lifelong therapy  3) No indication for thrombolytics or IVC filter for now.   4)follow up hem onc consult recs and hypercoagulable work up  5)out of bed to chair as tolerated, d/c foley catheter   I have personally obtained a history, examined the patient, evaluated Pertinent laboratory and RadioGraphic/imaging results, and  formulated the assessment and plan   The Patient requires high complexity decision making for assessment and support, frequent evaluation and titration of therapies. Time Spent with patient 30 mins   Verl Kitson Patricia Pesa, M.D.  [facility]  Medical Director [place] Director [facility] Cardio-Pulmonary Department

## 2015-07-19 NOTE — Care Management Important Message (Signed)
Important Message  Patient Details  Name: Bradley Bradley MRN: [id number] Date of Birth: 17-Dec-1966   Medicare Important Message Given:  Yes-second notification given    Juliann Pulse A Allmond 07/19/2015, 10:06 AM

## 2015-07-19 NOTE — Progress Notes (Signed)
ANTICOAGULATION CONSULT NOTE - Follow Up Consult  Pharmacy Consult for Heparin/Eliquis New Start Indication: pulmonary embolus  No Known Allergies  Patient Measurements: Height: 5\' 5"  (165.1 cm) Weight: 175 lb (79.379 kg) IBW/kg (Calculated) : 61.5 Heparin Dosing Weight: 77.6 kg  Vital Signs: Temp: 97.9 F (36.6 C) (08/02 0720) Temp Source: Oral (08/02 0720) BP: 128/88 mmHg (08/02 1200) Pulse Rate: 96 (08/02 1300)  Labs:  Recent Labs  07/17/15 1239 07/17/15 2149  07/18/15 0326 07/18/15 0334 07/18/15 0805  07/18/15 2327 07/19/15 0635 07/19/15 1302  HGB 13.0  --   --  11.7*  --   --   --   --  11.3*  --   HCT 41.6  --   --  37.0*  --   --   --   --  35.6*  --   PLT 335  --   --  323  --   --   --   --  306  --   LABPROT  --   --   --  14.5  --   --   --   --   --   --   INR  --   --   --  1.11  --   --   --   --   --   --   HEPARINUNFRC  --   --   < >  --   --  0.36  < > 0.47 0.71* 0.51  CREATININE 1.32*  --   --  1.02  --   --   --   --   --   --   TROPONINI 0.11* 1.70*  --   --  1.94* 1.29*  --   --   --   --   < > = values in this interval not displayed.  Estimated Creatinine Clearance: 86.1 mL/min (by C-G formula based on Cr of 1.02).   Medications:  Scheduled:  . antiseptic oral rinse  7 mL Mouth Rinse BID  . [START ON 07/20/2015] apixaban  10 mg Oral BID  . aspirin  162.5 mg Oral QHS  . docusate sodium  100 mg Oral BID  . fenofibrate  160 mg Oral Daily  . insulin aspart  0-9 Units Subcutaneous TID WC  . insulin aspart  13 Units Subcutaneous BID  . pravastatin  40 mg Oral QHS  . sodium chloride  3 mL Intravenous Q12H   Infusions:  . heparin 1,550 Units/hr (07/19/15 0619)   PRN: acetaminophen **OR** acetaminophen, azelastine, guaiFENesin, ondansetron **OR** ondansetron (ZOFRAN) IV, oxyCODONE  Assessment: 49 y/o M with BL PE.   HL on 8/2 at 0635: 0.71 HL on 8/2 at 1302: 0.51  Goal of Therapy:  Heparin level 0.3-0.7 units/ml Monitor platelets by  anticoagulation protocol: Yes   Plan:  Continue current heparin drip at rate of 1550 units/hr.  If HL <0.5, would increase drip rate to maintain level on higher side of range. HL and CBC ordered in AM.  Per MD consult, start Eliquis tomorrow (8/3).  Will order heparin drip to discontinued on 8/3 at 0959.  Eliquis 10 mg po BID to start on 8/3 at 1000.    Pharmacy will continue to follow.  Manha Amato G 07/19/2015,2:34 PM

## 2015-07-19 NOTE — Progress Notes (Signed)
ANTICOAGULATION CONSULT NOTE - Follow Up Consult  Pharmacy Consult for Heparin Indication: pulmonary embolus  No Known Allergies  Patient Measurements: Height: 5\' 5"  (165.1 cm) Weight: 175 lb (79.379 kg) IBW/kg (Calculated) : 61.5 Heparin Dosing Weight: 77.6 kg  Vital Signs: Temp: 97.9 F (36.6 C) (08/02 0720) Temp Source: Oral (08/02 0720) BP: 138/69 mmHg (08/02 1000) Pulse Rate: 170 (08/02 1100)  Labs:  Recent Labs  07/17/15 1239 07/17/15 2149  07/18/15 0326 07/18/15 0334 07/18/15 0805 07/18/15 1717 07/18/15 2327 07/19/15 0635  HGB 13.0  --   --  11.7*  --   --   --   --  11.3*  HCT 41.6  --   --  37.0*  --   --   --   --  35.6*  PLT 335  --   --  323  --   --   --   --  306  LABPROT  --   --   --  14.5  --   --   --   --   --   INR  --   --   --  1.11  --   --   --   --   --   HEPARINUNFRC  --   --   < >  --   --  0.36 0.55 0.47 0.71*  CREATININE 1.32*  --   --  1.02  --   --   --   --   --   TROPONINI 0.11* 1.70*  --   --  1.94* 1.29*  --   --   --   < > = values in this interval not displayed.  Estimated Creatinine Clearance: 86.1 mL/min (by C-G formula based on Cr of 1.02).   Medications:  Scheduled:  . antiseptic oral rinse  7 mL Mouth Rinse BID  . aspirin  162.5 mg Oral QHS  . docusate sodium  100 mg Oral BID  . fenofibrate  160 mg Oral Daily  . insulin aspart  0-9 Units Subcutaneous TID WC  . insulin aspart  13 Units Subcutaneous BID  . pravastatin  40 mg Oral QHS  . sodium chloride  3 mL Intravenous Q12H   Infusions:  . heparin 1,550 Units/hr (07/19/15 0619)   PRN: acetaminophen **OR** acetaminophen, azelastine, guaiFENesin, ondansetron **OR** ondansetron (ZOFRAN) IV, oxyCODONE  Assessment: 49 y/o M with BL PE.   HL on 8/2 at 0635: 0.71  Goal of Therapy:  Heparin level 0.3-0.7 units/ml Monitor platelets by anticoagulation protocol: Yes   Plan:  Per MD, would like HL to be within the upper limit of 0.3-0.7. HL slightly above range at  0.71. Will continue heparin drip to 1550 units/hr and recheck HL in 6 hours to ensure a dose change is not warranted.   Bradley Bradley G 07/19/2015,12:20 PM

## 2015-07-19 NOTE — Progress Notes (Addendum)
Pt alert and oriented.  On 4L nasal cannula O2 within normal limits, pt does destat to upper 80's with exertion. Pt foley out at 1000, pt voided at 1300 222mL.  Pt up to chair x2 without difficulty. No complaints of pain.  Heparin running at 1,550units/hr.  Report given to [place].

## 2015-07-20 ENCOUNTER — Inpatient Hospital Stay: Payer: Medicare Other

## 2015-07-20 LAB — CBC
HEMATOCRIT: 37.4 % — AB (ref 40.0–52.0)
Hemoglobin: 11.8 g/dL — ABNORMAL LOW (ref 13.0–18.0)
MCH: 28.2 pg (ref 26.0–34.0)
MCHC: 31.6 g/dL — AB (ref 32.0–36.0)
MCV: 89.3 fL (ref 80.0–100.0)
Platelets: 317 10*3/uL (ref 150–440)
RBC: 4.2 MIL/uL — ABNORMAL LOW (ref 4.40–5.90)
RDW: 15.9 % — AB (ref 11.5–14.5)
WBC: 8.1 10*3/uL (ref 3.8–10.6)

## 2015-07-20 LAB — GLUCOSE, CAPILLARY
GLUCOSE-CAPILLARY: 251 mg/dL — AB (ref 65–99)
GLUCOSE-CAPILLARY: 307 mg/dL — AB (ref 65–99)
Glucose-Capillary: 143 mg/dL — ABNORMAL HIGH (ref 65–99)
Glucose-Capillary: 140 mg/dL — ABNORMAL HIGH (ref 65–99)
Glucose-Capillary: 222 mg/dL — ABNORMAL HIGH (ref 65–99)
Glucose-Capillary: 202 mg/dL — ABNORMAL HIGH (ref 65–99)

## 2015-07-20 LAB — HEPARIN LEVEL (UNFRACTIONATED): Heparin Unfractionated: 0.24 IU/mL — ABNORMAL LOW (ref 0.30–0.70)

## 2015-07-20 MED ORDER — HEPARIN BOLUS VIA INFUSION
1200.0000 [IU] | Freq: Once | INTRAVENOUS | Status: AC
  Administered 2015-07-20: 1200 [IU] via INTRAVENOUS
  Filled 2015-07-20: qty 1200

## 2015-07-20 MED ORDER — IPRATROPIUM-ALBUTEROL 0.5-2.5 (3) MG/3ML IN SOLN
3.0000 mL | Freq: Four times a day (QID) | RESPIRATORY_TRACT | Status: DC | PRN
  Administered 2015-07-20: 3 mL via RESPIRATORY_TRACT
  Filled 2015-07-20: qty 3

## 2015-07-20 MED ORDER — INSULIN GLARGINE 100 UNIT/ML ~~LOC~~ SOLN
15.0000 [IU] | Freq: Every day | SUBCUTANEOUS | Status: DC
  Administered 2015-07-20: 15 [IU] via SUBCUTANEOUS
  Filled 2015-07-20 (×2): qty 0.15

## 2015-07-20 NOTE — Progress Notes (Signed)
Dr. Jannifer Franklin notified of increased SOB at rest post Duoneb, Mucinex, slow deep breaths and HOB 90'; CXR orderd. Barbaraann Faster, RN; 11:57 PM; 07/20/2015

## 2015-07-20 NOTE — Progress Notes (Addendum)
[facility] [place] NAME: Bradley Bradley    MR#:  [id number]  DATE OF BIRTH:  1966/10/18  SUBJECTIVE:  CHIEF COMPLAINT:   Chief Complaint  Patient presents with  . Weakness   Still shortness of breath, all oxygen and it cannular 4 L REVIEW OF SYSTEMS:  CONSTITUTIONAL: No fever, fatigue or weakness.  EYES: No blurred or double vision.  EARS, NOSE, AND THROAT: No tinnitus or ear pain.  RESPIRATORY: No cough, has shortness of breath, no wheezing or hemoptysis.  CARDIOVASCULAR: No chest pain, orthopnea, edema.  GASTROINTESTINAL: No nausea, vomiting, diarrhea or abdominal pain.  GENITOURINARY: No dysuria, hematuria.  ENDOCRINE: No polyuria, nocturia,  HEMATOLOGY: No anemia, easy bruising or bleeding SKIN: No rash or lesion. MUSCULOSKELETAL: No joint pain or arthritis.   NEUROLOGIC: No tingling, numbness, weakness.  PSYCHIATRY: No anxiety or depression.   DRUG ALLERGIES:  No Known Allergies  VITALS:  Blood pressure 138/84, pulse 93, temperature 98.3 F (36.8 C), temperature source Oral, resp. rate 16, height 5\' 5"  (1.651 m), weight 82.056 kg (180 lb 14.4 oz), SpO2 98 %.  PHYSICAL EXAMINATION:  GENERAL:  49 y.o.-year-old patient lying in the bed with no acute distress.  EYES: Pupils equal, round, reactive to light and accommodation. No scleral icterus. Extraocular muscles intact.  HEENT: Head atraumatic, normocephalic. Oropharynx and nasopharynx clear.  NECK:  Supple, no jugular venous distention. No thyroid enlargement, no tenderness.  LUNGS: Normal breath sounds bilaterally, no wheezing, rales,rhonchi or crepitation. No use of accessory muscles of respiration.  CARDIOVASCULAR: S1, S2 normal. No murmurs, rubs, or gallops.  ABDOMEN: Soft, nontender, nondistended. Bowel sounds present. No organomegaly or mass.  EXTREMITIES: No pedal edema, cyanosis, or clubbing.  NEUROLOGIC: Cranial nerves II through XII are intact.  Muscle strength 5/5 in all extremities. Sensation intact. Gait not checked.  PSYCHIATRIC: The patient is alert and oriented x 3.  SKIN: No obvious rash, lesion, or ulcer.    LABORATORY PANEL:   CBC  Recent Labs Lab 07/20/15 0439  WBC 8.1  HGB 11.8*  HCT 37.4*  PLT 317   ------------------------------------------------------------------------------------------------------------------  Chemistries   Recent Labs Lab 07/18/15 0326  NA 141  K 3.8  CL 109  CO2 27  GLUCOSE 116*  BUN 18  CREATININE 1.02  CALCIUM 8.7*  AST 73*  ALT 75*  ALKPHOS 38  BILITOT 0.1*   ------------------------------------------------------------------------------------------------------------------  Cardiac Enzymes  Recent Labs Lab 07/18/15 0805  TROPONINI 1.29*   ------------------------------------------------------------------------------------------------------------------  RADIOLOGY:  No results found.  EKG:   Orders placed or performed during the hospital encounter of 07/17/15  . EKG 12-Lead  . EKG 12-Lead    ASSESSMENT AND PLAN:   1. Acute respiratory failure with hypoxia secondary to bilateral submassive pulmonary embolism with right heart strain.  * bilateral submassive pulmonary embolism * Left leg DVT. Bilateral lower extremity venous Dopplers shows left leg DVT. * moderate pulmonary hypertension   Try to wean off O2. duoneb prn.  Discontinue heparin drip, change to eliquis bid.  2. Elevated troponin with right heart strain, Continue heparin drip, echo EF 60-65%, moderate pulmonary hypertension   3. History of diabetes mellitus Sliding scale insulin and lantus.  4. History of myotonic dystrophy Not on any home medications. PT.  5. Hyperkalemia  Improved after Kayexalate 1    All the records are reviewed and case discussed with Care Management/Social Workerr. Management plans discussed with the patient, his wife and they are in agreement. >50% time  spent on counselling and coordination of care.  CODE STATUS: Full code  TOTAL TIME TAKING CARE OF THIS PATIENT: 38 minutes.   POSSIBLE D/C IN 2 DAYS, DEPENDING ON CLINICAL CONDITION.   Demetrios Loll M.D on 07/20/2015 at 3:12 PM  Between 7am to 6pm - Pager - [phone]  After 6pm go to [email or web address] - password [email or web address] [facility]  Office  [phone]  CC: Primary care physician; Dion Body, MD

## 2015-07-20 NOTE — Progress Notes (Signed)
Improved since Monday. No new complaints  Filed Vitals:   07/19/15 1436 07/19/15 2019 07/20/15 0421 07/20/15 0759  BP: 133/80 135/87  138/84  Pulse: 100 89 104 93  Temp: 98.3 F (36.8 C) 97.7 F (36.5 C)  98.3 F (36.8 C)  TempSrc: Oral Oral  Oral  Resp: 18 20  16   Height: 5\' 5"  (1.651 m)     Weight: 82.056 kg (180 lb 14.4 oz)     SpO2: 99% 94% 96% 98%   2 lpm [place]  NAD No obvious JVD Chest clear Reg, no M NABS No edema  BMET    Component Value Date/Time   NA 141 07/18/2015 0326   NA 141 01/21/2012 0623   K 3.8 07/18/2015 0326   K 4.6 01/21/2012 0623   CL 109 07/18/2015 0326   CL 106 01/21/2012 0623   CO2 27 07/18/2015 0326   CO2 27 01/21/2012 0623   GLUCOSE 116* 07/18/2015 0326   GLUCOSE 191* 01/21/2012 0623   BUN 18 07/18/2015 0326   BUN 23* 01/21/2012 0623   CREATININE 1.02 07/18/2015 0326   CREATININE 0.89 01/21/2012 0623   CALCIUM 8.7* 07/18/2015 0326   CALCIUM 9.3 01/21/2012 0623   GFRNONAA >60 07/18/2015 0326   GFRAA >60 07/18/2015 0326    CBC    Component Value Date/Time   WBC 8.1 07/20/2015 0439   WBC 8.4 01/21/2012 0623   RBC 4.20* 07/20/2015 0439   RBC 4.45 01/21/2012 0623   HGB 11.8* 07/20/2015 0439   HGB 13.1 01/21/2012 0623   HCT 37.4* 07/20/2015 0439   HCT 40.1 01/21/2012 0623   PLT 317 07/20/2015 0439   PLT 226 01/21/2012 0623   MCV 89.3 07/20/2015 0439   MCV 90 01/21/2012 0623   MCH 28.2 07/20/2015 0439   MCH 29.5 01/21/2012 0623   MCHC 31.6* 07/20/2015 0439   MCHC 32.7 01/21/2012 0623   RDW 15.9* 07/20/2015 0439   RDW 14.4 01/21/2012 0623    No new CXR  IMPRESSION: Large PE with pulm hypertension by echocardiogram but hemodynamically stable since admission and improving clinically.  PLAN/REC: Lifelong anticoagulation with apixaban or equivalent Should be OK for DC home in 24-48 hrs Would repeat Echocardiogram in 6 wks to ensure that PA pressures and RV dilatation are improving PCCM will sign off. Please call if we can be  of further assistance  Merton Border, MD ; [facility] [phone].  After 5:30 PM or weekends, call [phone]

## 2015-07-20 NOTE — Progress Notes (Signed)
ANTICOAGULATION CONSULT NOTE - Follow Up Consult  Pharmacy Consult for Heparin Indication: pulmonary embolus  No Known Allergies  Patient Measurements: Height: 5\' 5"  (165.1 cm) (upon transfer) Weight: 180 lb 14.4 oz (82.056 kg) (upon transfer) IBW/kg (Calculated) : 61.5 Heparin Dosing Weight: 78.4 kg  Vital Signs: Temp: 97.7 F (36.5 C) (08/02 2019) Temp Source: Oral (08/02 2019) BP: 135/87 mmHg (08/02 2019) Pulse Rate: 104 (08/03 0421)  Labs:  Recent Labs  07/17/15 1239 07/17/15 2149  07/18/15 0326 07/18/15 0334 07/18/15 0805  07/19/15 0635 07/19/15 1302 07/20/15 0439  HGB 13.0  --   --  11.7*  --   --   --  11.3*  --  11.8*  HCT 41.6  --   --  37.0*  --   --   --  35.6*  --  37.4*  PLT 335  --   --  323  --   --   --  306  --  317  LABPROT  --   --   --  14.5  --   --   --   --   --   --   INR  --   --   --  1.11  --   --   --   --   --   --   HEPARINUNFRC  --   --   < >  --   --  0.36  < > 0.71* 0.51 0.24*  CREATININE 1.32*  --   --  1.02  --   --   --   --   --   --   TROPONINI 0.11* 1.70*  --   --  1.94* 1.29*  --   --   --   --   < > = values in this interval not displayed.  Estimated Creatinine Clearance: 87.3 mL/min (by C-G formula based on Cr of 1.02).   Medications:  Scheduled:  . apixaban  10 mg Oral BID  . aspirin  162.5 mg Oral QHS  . docusate sodium  100 mg Oral BID  . fenofibrate  160 mg Oral Daily  . heparin  1,200 Units Intravenous Once  . insulin aspart  0-9 Units Subcutaneous TID WC  . insulin aspart  13 Units Subcutaneous BID  . pravastatin  40 mg Oral QHS   Infusions:  . heparin 1,550 Units/hr (07/20/15 0111)   PRN: acetaminophen **OR** acetaminophen, azelastine, guaiFENesin, ondansetron **OR** ondansetron (ZOFRAN) IV, oxyCODONE, sodium chloride  Assessment: 49 y/o M with BL PE.  Goal of Therapy:  Heparin level 0.3-0.7 units/ml Monitor platelets by anticoagulation protocol: Yes   Plan:  Will bolus heparin 1200 units iv and  increase infusion to 1700 units/hr. Apixaban to start at 1000 and heparin drip off at 0959 so no further levels.   Ulice Dash D 07/20/2015,6:20 AM

## 2015-07-20 NOTE — Care Management Note (Signed)
Case Management Note  Patient Details  Name: Bradley Bradley MRN: [id number] Date of Birth: April 02, 1966  Subjective/Objective:                 Patient lives at home with wife.  Patient has history of myotonic dystrophy,  Patient presents with SOB. Dx with bilateral PE, and DVT.  Patient initiated on heparin drip. Transitioned to Eliquis today.  Patient uses [facility] on [address] [phone]  I called to get pricing on Eliquis $7.40 per month.  Notified patient and family. Will provided patient with 30 day free trail card.  Patient currently on 4L O2 [place].  Patient is not chronically on home O2.  Patient utilizes a cane for ambulation.  Wife expresses concern of increased weakness.  Dr. Bridgett Larsson to order PT consult.    Action/Plan:  Nursing to wean O2 as tolerated.  Patient will nee qualifying O2 saturation and diagnosis if home O2 is required,  PT consult, follow up with recommendations.  Case management to follow for discharge planning,   Expected Discharge Date:                  Expected Discharge Plan:  Home/Self Care  In-House Referral:     Discharge planning Services  CM Consult  Post Acute Care Choice:    Choice offered to:     DME Arranged:    DME Agency:     HH Arranged:    HH Agency:     Status of Service:  In process, will continue to follow  Medicare Important Message Given:  Yes-second notification given Date Medicare IM Given:    Medicare IM give by:    Date Additional Medicare IM Given:    Additional Medicare Important Message give by:     If discussed at [place] of Stay Meetings, dates discussed:    Additional Comments:  Beverly Sessions, RN 07/20/2015, 1:46 PM

## 2015-07-21 DIAGNOSIS — G7111 Myotonic muscular dystrophy: Secondary | ICD-10-CM

## 2015-07-21 DIAGNOSIS — J9600 Acute respiratory failure, unspecified whether with hypoxia or hypercapnia: Secondary | ICD-10-CM

## 2015-07-21 LAB — URINALYSIS COMPLETE WITH MICROSCOPIC (ARMC ONLY)
Bacteria, UA: NONE SEEN
Bilirubin Urine: NEGATIVE
LEUKOCYTES UA: NEGATIVE
Nitrite: NEGATIVE
PROTEIN: NEGATIVE mg/dL
SPECIFIC GRAVITY, URINE: 1.008 (ref 1.005–1.030)
Squamous Epithelial / LPF: NONE SEEN
pH: 6 (ref 5.0–8.0)

## 2015-07-21 LAB — BASIC METABOLIC PANEL
Anion gap: 7 (ref 5–15)
BUN: 19 mg/dL (ref 6–20)
CO2: 28 mmol/L (ref 22–32)
Calcium: 8.7 mg/dL — ABNORMAL LOW (ref 8.9–10.3)
Chloride: 105 mmol/L (ref 101–111)
Creatinine, Ser: 1 mg/dL (ref 0.61–1.24)
GFR calc Af Amer: >60 mL/min (ref >60)
GFR calc non Af Amer: >60 mL/min (ref >60)
GLUCOSE: 330 mg/dL — AB (ref 65–99)
POTASSIUM: 4.4 mmol/L (ref 3.5–5.1)
SODIUM: 140 mmol/L (ref 135–145)

## 2015-07-21 LAB — BLOOD GAS, ARTERIAL
ACID-BASE EXCESS: 0.1 mmol/L (ref 0.0–3.0)
Allens test (pass/fail): POSITIVE — AB
BICARBONATE: 27.6 meq/L (ref 21.0–28.0)
DELIVERY SYSTEMS: POSITIVE
Expiratory PAP: 5
FIO2: 0.6
Inspiratory PAP: 14
Mechanical Rate: 10
O2 Saturation: 96.9 %
PATIENT TEMPERATURE: 37
PH ART: 7.3 — AB (ref 7.350–7.450)
pCO2 arterial: 56 mmHg — ABNORMAL HIGH (ref 32.0–48.0)
pO2, Arterial: 98 mmHg (ref 83.0–108.0)

## 2015-07-21 LAB — CBC
HCT: 31.3 % — ABNORMAL LOW (ref 40.0–52.0)
Hemoglobin: 10.1 g/dL — ABNORMAL LOW (ref 13.0–18.0)
MCH: 28.2 pg (ref 26.0–34.0)
MCHC: 32.4 g/dL (ref 32.0–36.0)
MCV: 87.1 fL (ref 80.0–100.0)
Platelets: 346 10*3/uL (ref 150–440)
RBC: 3.59 MIL/uL — ABNORMAL LOW (ref 4.40–5.90)
RDW: 15.2 % — ABNORMAL HIGH (ref 11.5–14.5)
WBC: 8.3 10*3/uL (ref 3.8–10.6)

## 2015-07-21 LAB — GLUCOSE, CAPILLARY
GLUCOSE-CAPILLARY: 343 mg/dL — AB (ref 65–99)
GLUCOSE-CAPILLARY: 250 mg/dL — AB (ref 65–99)
GLUCOSE-CAPILLARY: 98 mg/dL (ref 65–99)
Glucose-Capillary: 290 mg/dL — ABNORMAL HIGH (ref 65–99)
Glucose-Capillary: 127 mg/dL — ABNORMAL HIGH (ref 65–99)

## 2015-07-21 LAB — APTT
APTT: 65 s — AB (ref 24–36)
aPTT: 31 seconds (ref 24–36)

## 2015-07-21 LAB — HEMOGLOBIN A1C: Hgb A1c MFr Bld: 8.7 % — ABNORMAL HIGH (ref 4.0–6.0)

## 2015-07-21 LAB — MRSA PCR SCREENING: MRSA BY PCR: NEGATIVE

## 2015-07-21 LAB — CREATININE, URINE, RANDOM: Creatinine, Urine: 18 mg/dL

## 2015-07-21 MED ORDER — IPRATROPIUM-ALBUTEROL 0.5-2.5 (3) MG/3ML IN SOLN
3.0000 mL | Freq: Once | RESPIRATORY_TRACT | Status: AC
  Administered 2015-07-21: 3 mL via RESPIRATORY_TRACT
  Filled 2015-07-21: qty 3

## 2015-07-21 MED ORDER — INSULIN ASPART 100 UNIT/ML ~~LOC~~ SOLN
0.0000 [IU] | SUBCUTANEOUS | Status: DC
  Administered 2015-07-21: 5 [IU] via SUBCUTANEOUS
  Administered 2015-07-21: 2 [IU] via SUBCUTANEOUS
  Administered 2015-07-22: 3 [IU] via SUBCUTANEOUS
  Administered 2015-07-22: 5 [IU] via SUBCUTANEOUS
  Administered 2015-07-22: 2 [IU] via SUBCUTANEOUS
  Administered 2015-07-22 – 2015-07-23 (×2): 5 [IU] via SUBCUTANEOUS
  Administered 2015-07-23: 2 [IU] via SUBCUTANEOUS
  Administered 2015-07-23: 3 [IU] via SUBCUTANEOUS
  Administered 2015-07-24: 8 [IU] via SUBCUTANEOUS
  Administered 2015-07-24: 2 [IU] via SUBCUTANEOUS
  Administered 2015-07-25: 5 [IU] via SUBCUTANEOUS
  Filled 2015-07-21: qty 8
  Filled 2015-07-21: qty 3
  Filled 2015-07-21 (×2): qty 2
  Filled 2015-07-21 (×2): qty 5
  Filled 2015-07-21: qty 2
  Filled 2015-07-21 (×2): qty 5
  Filled 2015-07-21: qty 2
  Filled 2015-07-21: qty 3

## 2015-07-21 MED ORDER — LABETALOL HCL 5 MG/ML IV SOLN
10.0000 mg | INTRAVENOUS | Status: DC | PRN
  Administered 2015-07-21 – 2015-07-22 (×3): 10 mg via INTRAVENOUS
  Filled 2015-07-21 (×3): qty 4

## 2015-07-21 MED ORDER — INSULIN GLARGINE 100 UNIT/ML ~~LOC~~ SOLN
21.0000 [IU] | Freq: Every day | SUBCUTANEOUS | Status: DC
  Administered 2015-07-21: 21 [IU] via SUBCUTANEOUS
  Filled 2015-07-21 (×2): qty 0.21

## 2015-07-21 MED ORDER — SODIUM CHLORIDE 0.9 % IV SOLN
1500.0000 mg | Freq: Two times a day (BID) | INTRAVENOUS | Status: DC
  Administered 2015-07-21: 1500 mg via INTRAVENOUS
  Filled 2015-07-21 (×3): qty 1500

## 2015-07-21 MED ORDER — APIXABAN 5 MG PO TABS: 5.0000 mg | ORAL_TABLET | Freq: Two times a day (BID) | ORAL | Status: DC

## 2015-07-21 MED ORDER — BUDESONIDE 0.5 MG/2ML IN SUSP
0.5000 mg | Freq: Two times a day (BID) | RESPIRATORY_TRACT | Status: DC
  Administered 2015-07-21 – 2015-07-25 (×8): 0.5 mg via RESPIRATORY_TRACT
  Filled 2015-07-21 (×8): qty 2

## 2015-07-21 MED ORDER — HEPARIN BOLUS VIA INFUSION
4000.0000 [IU] | Freq: Once | INTRAVENOUS | Status: AC
  Administered 2015-07-21: 4000 [IU] via INTRAVENOUS
  Filled 2015-07-21: qty 4000

## 2015-07-21 MED ORDER — CETYLPYRIDINIUM CHLORIDE 0.05 % MT LIQD
7.0000 mL | Freq: Two times a day (BID) | OROMUCOSAL | Status: DC
  Administered 2015-07-21 – 2015-07-22 (×2): 7 mL via OROMUCOSAL

## 2015-07-21 MED ORDER — VANCOMYCIN HCL IN DEXTROSE 1-5 GM/200ML-% IV SOLN: 1000.0000 mg | INTRAVENOUS | Status: DC

## 2015-07-21 MED ORDER — DIAZEPAM 5 MG/ML IJ SOLN: 5.0000 mg | Freq: Four times a day (QID) | INTRAMUSCULAR | Status: DC | PRN

## 2015-07-21 MED ORDER — HEPARIN (PORCINE) IN NACL 100-0.45 UNIT/ML-% IJ SOLN
1600.0000 [IU]/h | INTRAMUSCULAR | Status: DC
  Administered 2015-07-21: 1300 [IU]/h via INTRAVENOUS
  Administered 2015-07-22: 1600 [IU]/h via INTRAVENOUS
  Filled 2015-07-21 (×4): qty 250

## 2015-07-21 MED ORDER — VANCOMYCIN HCL 10 G IV SOLR
1500.0000 mg | Freq: Once | INTRAVENOUS | Status: AC
  Administered 2015-07-21: 1500 mg via INTRAVENOUS
  Filled 2015-07-21: qty 1500

## 2015-07-21 MED ORDER — LORAZEPAM 2 MG/ML IJ SOLN
0.5000 mg | Freq: Once | INTRAMUSCULAR | Status: AC
  Administered 2015-07-21: 0.5 mg via INTRAVENOUS
  Filled 2015-07-21: qty 1

## 2015-07-21 MED ORDER — PIPERACILLIN-TAZOBACTAM 3.375 G IVPB 30 MIN
3.3750 g | Freq: Three times a day (TID) | INTRAVENOUS | Status: DC
  Administered 2015-07-21 (×2): 3.375 g via INTRAVENOUS
  Filled 2015-07-21 (×5): qty 50

## 2015-07-21 MED ORDER — DEXMEDETOMIDINE HCL IN NACL 400 MCG/100ML IV SOLN
0.4000 ug/kg/h | INTRAVENOUS | Status: DC
  Administered 2015-07-21: 1.199 ug/kg/h via INTRAVENOUS
  Filled 2015-07-21: qty 100

## 2015-07-21 MED ORDER — FUROSEMIDE 10 MG/ML IJ SOLN
20.0000 mg | Freq: Once | INTRAMUSCULAR | Status: AC
  Administered 2015-07-21: 20 mg via INTRAVENOUS
  Filled 2015-07-21: qty 2

## 2015-07-21 MED ORDER — IPRATROPIUM-ALBUTEROL 0.5-2.5 (3) MG/3ML IN SOLN
3.0000 mL | Freq: Four times a day (QID) | RESPIRATORY_TRACT | Status: DC
  Administered 2015-07-21 – 2015-07-22 (×4): 3 mL via RESPIRATORY_TRACT
  Filled 2015-07-21 (×4): qty 3

## 2015-07-21 NOTE — Progress Notes (Signed)
ANTIBIOTIC CONSULT NOTE - INITIAL  Pharmacy Consult for Vancomycin Indication: Sepsis/Aspiration  No Known Allergies3  Patient Measurements: Height: 5\' 5"  (165.1 cm) (upon transfer) Weight: 180 lb 14.4 oz (82.056 kg) (upon transfer) IBW/kg (Calculated) : 61.5 Adjusted Body Weight: 69.7 kg  Vital Signs: Temp: 98.3 F (36.8 C) (08/04 0134) Temp Source: Oral (08/04 0134) BP: 165/102 mmHg (08/04 0253) Pulse Rate: 137 (08/04 0253) Intake/Output from previous day: 08/03 0701 - 08/04 0700 In: 480 [P.O.:480] Out: 1800 [Urine:1800] Intake/Output from this shift: Total I/O In: 0  Out: 350 [Urine:350]  Labs:  Recent Labs  07/19/15 0635 07/20/15 0439  WBC 8.4 8.1  HGB 11.3* 11.8*  PLT 306 317   Estimated Creatinine Clearance: 87.3 mL/min (by C-G formula based on Cr of 1.02). No results for input(s): VANCOTROUGH, VANCOPEAK, VANCORANDOM, GENTTROUGH, GENTPEAK, GENTRANDOM, TOBRATROUGH, TOBRAPEAK, TOBRARND, AMIKACINPEAK, AMIKACINTROU, AMIKACIN in the last 72 hours.   Microbiology: Recent Results (from the past 720 hour(s))  Blood culture (routine x 2)     Status: None (Preliminary result)   Collection Time: 07/17/15  2:50 PM  Result Value Ref Range Status   Specimen Description BLOOD RIGHT WRIST  Final   Special Requests BAA,5MLAER,5MLANA  Final   Culture NO GROWTH 3 DAYS  Final   Report Status PENDING  Incomplete  Blood culture (routine x 2)     Status: None (Preliminary result)   Collection Time: 07/17/15  3:05 PM  Result Value Ref Range Status   Specimen Description BLOOD LEFT WRIST  Final   Special Requests BAA,5MLAER,5MLANA  Final   Culture NO GROWTH 3 DAYS  Final   Report Status PENDING  Incomplete  MRSA PCR Screening     Status: None   Collection Time: 07/17/15  9:27 PM  Result Value Ref Range Status   MRSA by PCR NEGATIVE NEGATIVE Final    Comment:        The GeneXpert MRSA Assay (FDA approved for NASAL specimens only), is one component of a comprehensive MRSA  colonization surveillance program. It is not intended to diagnose MRSA infection nor to guide or monitor treatment for MRSA infections.     Medical History: Past Medical History  Diagnosis Date  . Myotonic dystrophy   . Diabetes mellitus without complication     TYPE ONE    Medications:  Infusions:  . dexmedetomidine     Assessment: 49 yom coded, concern for aspiration so Vanc/Zosyn started.   Goal of Therapy:  Vancomycin trough level 15-20 mcg/ml  Plan:  Expected duration 7 days with resolution of temperature and/or normalization of WBC Measure antibiotic drug levels at steady state Follow up culture results  Started vancomycin 1.5 gm IV Q12H with stacked dosing. Will follow up steady state serum concentrations and adjust as needed to maintain trough 15 to 20 mcg/mL.   Charlann Lange Derward Marple 07/21/2015,3:27 AM

## 2015-07-21 NOTE — Progress Notes (Signed)
PT Cancellation Note  Patient Details Name: Bradley Bradley MRN: [id number] DOB: 29-Nov-1966   Cancelled Treatment:    Reason Eval/Treat Not Completed:  (See PT note for further details) PT evaluation attempted this AM. Per chart review and communication with nursing staff, pt not appropriate for PT this date secondary to onset of confusion, agitation, O2 sat drop, and subsequent transfer back to ICU. Will attempt at later time/date when appropriate.    Janyth Contes 07/21/2015, 10:28 AM Janyth Contes, SPT. [phone]

## 2015-07-21 NOTE — Progress Notes (Signed)
   07/21/15 0300  Clinical Encounter Type  Visited With Family  Visit Type Spiritual support  Spiritual Encounters  Spiritual Needs Grief support  Stress Factors  Patient Stress Factors Health changes   Status: 61yr male, Bilateral pulmonary embolism Family: wife and brother n law Visit Assessment: The chaplain sat with the wife until the brother n law came and then visited with both. The wife, a Pharmacist, hospital, is grieving. Chaplain gave her hugs as needed and just listened to her express feelings. Her husband had been yelling and choked on water while watching TV, she said. The chaplain listened while they made the transf to CCU to stabalize the patient, checking periodically for when the wife would be allowed to visit . She shared that her mother has been disabled for the 16 years that they have been married and that she lives with them. The patient was also a Nurses' Aide. The family will be lifted in prayer.  Pastoral Care: [phone] pager or available online

## 2015-07-21 NOTE — Progress Notes (Signed)
Received pt from floor. Pt is very agitated and confused.S tach on CM. Precedex drip started for agitation.  Placed on Bi-pap. Sao296%.. Lung sound Wheeze. Inserted foley cath draing clear yellow urine.(868ml)Family at bed side.Family update..resting comfortably without any distress. Will continue to observeclosely.

## 2015-07-21 NOTE — Progress Notes (Signed)
Update:  Pt has progressive dyspnea and drifting down O2 sats.  Duonebs minimally helpful.  On appropriate full anticoagulation for PE.  Pt became agitated and confused.  Moved to ICU, CXR showed alveolar edema vs infectious infiltrate.  Concern is high for possible aspiration.  Lasix given, foley placed as he had 700cc urine in bladder on bladder scan, broad IV abx started as pt became febrile with tachycardia and tachypnea meeting sepsis criteria.  Blood and urine cultures ordered.  O2 via cannula changed to non rebreather with improvement in O2 sats.  Precedex with RASS goal 0 started for agitaton.  Jacqulyn [facility] 07/21/2015, 3:17 AM

## 2015-07-21 NOTE — Progress Notes (Signed)
   07/21/15 0900  Clinical Encounter Type  Visited With Patient and family together  Visit Type Spiritual support  Referral From Chaplain  Consult/Referral To Chaplain  Spiritual Encounters  Spiritual Needs Prayer;Emotional  Stress Factors  Patient Stress Factors Exhausted;Health changes;Major life changes  Family Stress Factors Exhausted;Family relationships;Major life changes  Met w/patient & family. Patient sedated. Provided pastoral care & prayer w/family. Will return when patient is awake. Chap. Natajah Derderian G. Coy

## 2015-07-21 NOTE — Progress Notes (Signed)
Orders for Negative Inspiratory Force- measurement was -18

## 2015-07-21 NOTE — Progress Notes (Signed)
   07/21/15 1510  Clinical Encounter Type  Visited With Patient and family together  Visit Type Spiritual support  Referral From Nurse  Consult/Referral To Chaplain  Spiritual Encounters  Spiritual Needs Prayer;Emotional  Stress Factors  Patient Stress Factors Health changes  Family Stress Factors Health changes  Chaplain rounded in the unit and offered spiritual support and a compassionate presence to the patient and his wife. Patient requested prayer for health and made a confession. Prayers were offered and support offered as applicable. Chaplain Shawnika Pepin A. Jaquila Santelli Ext. [phone]

## 2015-07-21 NOTE — Progress Notes (Signed)
Dr. Jannifer Franklin notified of VS: elevated BP/pulse, O2 Sat 90%/4L; restlessness; "something not right"; New orders written; Barbaraann Faster, RN; 07/21/2015; 1:46 AM

## 2015-07-21 NOTE — Progress Notes (Signed)
Inpatient Diabetes Program Recommendations  AACE/ADA: New Consensus Statement on Inpatient Glycemic Control (2013)  Target Ranges:  Prepandial:   less than 140 mg/dL      Peak postprandial:   less than 180 mg/dL (1-2 hours)      Critically ill patients:  140 - 180 mg/dL   Reason for assessment: elevated CBG  Diabetes history: Notes state Type 1 but patient is not on basal bolus as required by a patient with Type 1 and is on oral agent, A1C 7.7% in Feb. 2016 Outpatient Diabetes medications: Glucophage 1000mg  bid, Apidra 10-25 units bid based on a sliding scale  Current orders for Inpatient glycemic control: Lantus 15 units qhs, Novolog 0-9 units tid  Please consider increasing Lantus to 25 units (0.3units/kg), adding mealtime insulin; Novolog 5 units tid with meals and adding hs correction insulin 0-5 units qhs. Continue Novolog correction 0-9 units tid with meals.   Gentry Fitz, RN, BA, MHA, CDE Diabetes Coordinator Inpatient Diabetes Program  [phone] (Team Pager) [phone] ([place]) 07/21/2015 10:09 AM

## 2015-07-21 NOTE — Progress Notes (Signed)
  Inpatient Diabetes Program Recommendations  AACE/ADA: New Consensus Statement on Inpatient Glycemic Control (2013)  Target Ranges:  Prepandial:   less than 140 mg/dL      Peak postprandial:   less than 180 mg/dL (1-2 hours)      Critically ill patients:  140 - 180 mg/dL   Outpatient Diabetes medications: Glucophage 1000mg  bid, Apidra 10-25 units bid based on a sliding scale  Current orders for Inpatient glycemic control: Lantus 15 units qhs, Novolog 0-9 units tid   Patient is not currently eating;   Please consider increasing Lantus to 21 units (0.25units/kg).  Consider Novolog moderate correction scale 0-15 units q4h.  Gentry Fitz, RN, BA, MHA, CDE Diabetes Coordinator Inpatient Diabetes Program  [phone] (Team Pager) [phone] ([place]) 07/21/2015 10:33 AM

## 2015-07-21 NOTE — Progress Notes (Addendum)
Spoke to Dr. Bridgett Larsson this am- updated patient on new ST elevation on monitor, pt on bipap, no pupil reaction, adequate urine output during night.  Dr. Bridgett Larsson reassessed pupils and stated they were sluggish.  Dr. Clayborn Bigness notified of ST elevation- EKG ordered and showed NSR with first degree heart block- no new orders- stated it was respiratory driven.  During rounds- Dr. Mortimer Fries aware that patient is now on highflow- patient still confused-and lethargic- holding PO meds- Asked if he wanted a repeat ABG- Dr. Mortimer Fries asked to hold off right now- plan to start heparin drip due to pt unable to take PO meds at this time- Pt on vancyomycin and zoysn. Spoke to diabetes coordinator- made her aware that patient is not eating with his lethargy at this time- Blood sugars in high 200's and in 300's.  They are to reassess and placed new note to suggest about changing sliding scale/lantus.

## 2015-07-21 NOTE — Progress Notes (Signed)
ANTICOAGULATION CONSULT NOTE - Follow Up Consult  Pharmacy Consult for Heparin Indication: pulmonary embolus  No Known Allergies  Patient Measurements: Height: 5\' 5"  (165.1 cm) (upon transfer) Weight: 180 lb 14.4 oz (82.056 kg) (upon transfer) IBW/kg (Calculated) : 61.5 Heparin Dosing Weight: 78.4 kg  Vital Signs: Temp: 97.5 F (36.4 C) (08/04 2015) Temp Source: Oral (08/04 2015) BP: 174/90 mmHg (08/04 2225) Pulse Rate: 113 (08/04 2235)  Labs:  Recent Labs  07/19/15 0635 07/19/15 1302 07/20/15 0439 07/21/15 0742 07/21/15 1247 07/21/15 2203  HGB 11.3*  --  11.8* 10.1*  --   --   HCT 35.6*  --  37.4* 31.3*  --   --   PLT 306  --  317 346  --   --   APTT  --   --   --   --  31 65*  HEPARINUNFRC 0.71* 0.51 0.24*  --   --   --   CREATININE  --   --   --  1.00  --   --     Estimated Creatinine Clearance: 89.1 mL/min (by C-G formula based on Cr of 1).   Medications:  Scheduled:  . antiseptic oral rinse  7 mL Mouth Rinse BID  . aspirin  162.5 mg Oral QHS  . budesonide (PULMICORT) nebulizer solution  0.5 mg Nebulization BID  . docusate sodium  100 mg Oral BID  . fenofibrate  160 mg Oral Daily  . insulin aspart  0-15 Units Subcutaneous 6 times per day  . insulin glargine  21 Units Subcutaneous QHS  . ipratropium-albuterol  3 mL Nebulization Q6H  . pravastatin  40 mg Oral QHS   Infusions:  . heparin 1,300 Units/hr (07/21/15 1504)   PRN: acetaminophen **OR** acetaminophen, azelastine, guaiFENesin, labetalol, ondansetron **OR** ondansetron (ZOFRAN) IV, oxyCODONE, sodium chloride  Assessment: 49 y/o M with BL PE patient previously converted to Eliquis, but is unable to tolerate oral medications at this time. Patient received last dose in evening on 8/3.    Goal of Therapy:  APTT: 68-109 Heparin level 0.3-0.7 units/ml Monitor platelets by anticoagulation protocol: Yes   Plan:   APTT 65 this evening, will increase by 1 mL/hr. Ordered aPTT and heparin level for  tomorrow morning.   Charlann Lange Rhyleigh Grassel 07/21/2015,10:45 PM

## 2015-07-21 NOTE — Consult Note (Signed)
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 Myotonic dystrophy   . 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 Marital Status: Married    Spouse Name: N/A  . Number of Children: N/A  . Years of Education: N/A   Occupational History  . Not on file.   Social History Main Topics  . Smoking status: Former Research scientist (life sciences)  . Smokeless tobacco: Not on file  . Alcohol Use: Yes     Comment: occasionally  . Drug Use: Not on file  . Sexual Activity: Not on file   Other Topics Concern  . Not on file   Social History Narrative    Family History  Problem Relation Age of Onset  . Pulmonary embolism Brother     ROS - Review of Systems  Unable to perform ROS: critical illness     Filed Vitals:   07/21/15 0405 07/21/15 0700 07/21/15 0800 07/21/15 0900  BP:  97/67 88/63 110/74  Pulse: 82 63 57 61  Temp:   97.5 F (36.4 C)   TempSrc:   Axillary   Resp:  13 14 12   Height:      Weight:      SpO2: 97% 99% 100% 99%   Physical Examination:   VS: BP 110/74 mmHg  Pulse 61  Temp(Src) 97.5 F (36.4 C) (Axillary)  Resp 12  Ht 5\' 5"  (1.651 m)  Wt 180 lb 14.4 oz (82.056 kg)  BMI 30.10 kg/m2  SpO2 99%  General Appearance: resp distress Neuro:without focal findings, mental status, speech-soft spoken, lethargic,  sensation grossly normal  2-3/5 muscle weakness UE HEENT: PERRLA, EOM intact, no ptosis, no other lesions noticed;  Pulmonary: normal breath sounds., diaphragmatic excursion abnormal, No rales;      CardiovascularNormal S1,S2.  No m/r/g.   Abdomen: Benign, Soft,  non-tender, No masses, hepatosplenomegaly, No lymphadenopathy Renal:  No costovertebral tenderness  GU:  No performed at this time. Endoc: No evident thyromegaly, no signs of acromegaly or Cushing features Skin:   warm, no rashes, no ecchymosis  Extremities: normal, no cyanosis, clubbing, no edema, warm with normal capillary refill.   DATA:  BMET    Component Value Date/Time   NA 140 07/21/2015 0742   NA 141 01/21/2012 0623   K 4.4 07/21/2015 0742   K 4.6 01/21/2012 0623   CL 105 07/21/2015 0742   CL 106 01/21/2012 0623   CO2 28 07/21/2015 0742   CO2 27 01/21/2012 0623   GLUCOSE 330* 07/21/2015 0742   GLUCOSE 191* 01/21/2012 0623   BUN 19 07/21/2015 0742   BUN 23* 01/21/2012 0623   CREATININE 1.00 07/21/2015 0742   CREATININE 0.89 01/21/2012 0623   CALCIUM 8.7* 07/21/2015 0742   CALCIUM 9.3 01/21/2012 0623   GFRNONAA >60 07/21/2015 0742   GFRAA >60 07/21/2015 0742    CBC    Component Value Date/Time   WBC 8.3 07/21/2015 0742   WBC 8.4 01/21/2012 0623   RBC 3.59* 07/21/2015 0742   RBC 4.45 01/21/2012 0623   HGB 10.1* 07/21/2015 0742   HGB  13.1 01/21/2012 0623   HCT 31.3* 07/21/2015 0742   HCT 40.1 01/21/2012 0623   PLT 346 07/21/2015 0742   PLT 226 01/21/2012 0623   MCV 87.1 07/21/2015 0742   MCV 90 01/21/2012 0623   MCH 28.2 07/21/2015 0742   MCH 29.5 01/21/2012 0623   MCHC 32.4 07/21/2015 0742   MCHC 32.7 01/21/2012 0623   RDW 15.2* 07/21/2015 0742   RDW 14.4 01/21/2012 0623   CXR: mild bibasilar atelectasis CT chest: large bilateral PE. Dilated RV EKG: No S1Q3T3 pattern. NSST changes LE venous Doppler scans: small residual DVT on L  IMPRESSION:    49 yo white male with acute B/l PE with underlying muscle dystrophy now with hypoxic and hypercapnic resp failure Patient at high risk for intubation   1.Respiratory Failure-from PE and exacebation of Myotonic dystrophy -continue biPAP and High flow [place] as tolerated -continue Bronchodilator Therapy -check  NIF -speech therapy  2.PE-start heparin infusion  Family updated and notified of poor situation  I have personally obtained a history, examined the patient, evaluated Pertinent laboratory and RadioGraphic/imaging results, and  formulated the assessment and plan   The Patient requires high complexity decision making for assessment and support, frequent evaluation and titration of therapies, application of advanced monitoring technologies and extensive interpretation of multiple databases. Critical Care Time devoted to patient care services described in this note is 45 minutes.   Overall, patient is critically ill, prognosis is guarded.  Patient with Multiorgan failure and at high risk for cardiac arrest and death.    Corrin Parker, M.D.  [facility]  Medical Director [place] Director [facility] Cardio-Pulmonary Department

## 2015-07-21 NOTE — Progress Notes (Signed)
Patient alert and oriented at this time- still a bit lethargic- but will follow commands. Vitals stable- NSR on monitor- 1st degree heart block.  Speech evaluation ordered- Per Dr. Mortimer Fries- do not give PO until speech assesses patient.  Pt able to have ice chips and tolerates well. Urine output adequate.

## 2015-07-21 NOTE — Progress Notes (Addendum)
[facility] [place] NAME: Olawale Marney    MR#:  [id number]  DATE OF BIRTH:  [birth year]  SUBJECTIVE:  CHIEF COMPLAINT:   Chief Complaint  Patient presents with  . Weakness   The patient is lethargic, on BiPAP. He is on sedation, unable to communicate. REVIEW OF SYSTEMS:  Unable to obtain.  DRUG ALLERGIES:  No Known Allergies  VITALS:  Blood pressure 112/76, pulse 82, temperature 101.9 F (38.8 C), temperature source Oral, resp. rate 19, height 5\' 5"  (1.651 m), weight 82.056 kg (180 lb 14.4 oz), SpO2 97 %.  PHYSICAL EXAMINATION:  GENERAL:  49 y.o.-year-old patient lying in the bed, lethargy. On BiPAP EYES: Pupils equal, round, sluggish but reactive to light. No scleral icterus. Extraocular muscles intact.  HEENT: Head atraumatic, normocephalic. NECK:  Supple, no jugular venous distention. No thyroid enlargement, no tenderness.  LUNGS: Normal breath sounds bilaterally, no wheezing,But has crackles accessory muscles of respiration.  CARDIOVASCULAR: S1, S2 normal. No murmurs, rubs, or gallops.  ABDOMEN: Soft, nontender, nondistended. Bowel sounds present. No organomegaly or mass.  EXTREMITIES: No pedal edema, cyanosis, or clubbing.  NEUROLOGIC:  lethargy and on sedation.  PSYCHIATRIC: lethargy and on sedation.  SKIN: No obvious rash, lesion, or ulcer.    LABORATORY PANEL:   CBC  Recent Labs Lab 07/20/15 0439  WBC 8.1  HGB 11.8*  HCT 37.4*  PLT 317   ------------------------------------------------------------------------------------------------------------------  Chemistries   Recent Labs Lab 07/18/15 0326  NA 141  K 3.8  CL 109  CO2 27  GLUCOSE 116*  BUN 18  CREATININE 1.02  CALCIUM 8.7*  AST 73*  ALT 75*  ALKPHOS 38  BILITOT 0.1*   ------------------------------------------------------------------------------------------------------------------  Cardiac Enzymes  Recent Labs Lab  07/18/15 0805  TROPONINI 1.29*   ------------------------------------------------------------------------------------------------------------------  RADIOLOGY:  Dg Chest Port 1 View  07/21/2015   CLINICAL DATA:  Dyspnea and wheezing tonight  EXAM: PORTABLE CHEST - 1 VIEW  COMPARISON:  07/17/2015  FINDINGS: Ground-glass opacities are developing in the central lung regions bilaterally. This may represent alveolar edema. Infectious infiltrate is not excluded. There is no large effusion. There is unchanged right hemidiaphragm elevation.  IMPRESSION: Developing alveolar edema versus infectious infiltrates in the central lung regions bilaterally.   Electronically Signed   By: Andreas Newport M.D.   On: 07/21/2015 00:50    EKG:   Orders placed or performed during the hospital encounter of 07/17/15  . EKG 12-Lead  . EKG 12-Lead    ASSESSMENT AND PLAN:   1. Acute respiratory failure with hypoxia secondary to aspiration pneumonia and bilateral submassive pulmonary embolism   Continue BiPAP with a nebulizer treatment, follow-up critical care physician.  For aspiration pneumonia, continue Zosyn and vancomycin IV and follow-up CBC and blood culture. Aspiration precaution.  * bilateral submassive pulmonary embolism * Left leg DVT.  * moderate pulmonary hypertension  change to heparin drip and discontinue Eliquis per Dr. Mortimer Fries.  * Hypotension. The patient systolic blood pressure dropped to 80s,  possible due to sedation, will wean off sedation   2. Elevated troponin with right heart strain, change to heparin drip and discontinue Eliquis.  echo EF 60-65%, moderate pulmonary hypertension Telemetry monitor show possible ST elevation. I discussed with the Dr. Clayborn Bigness, he suggested getting EKG but he doesn't think acute respiratory failure is cardiac related. EKG showed NSR with 1st degree block.  3. History of diabetes mellitus Sliding scale insulin and lantus.  4. History of myotonic  dystrophy Not on any home medications. PT.  5. Hyperkalemia  Improved after Kayexalate 1    I discussed with the Dr. Clayborn Bigness,  All the records are reviewed and case discussed with Care Management/Social Workerr. Management plans discussed with the patient, his wife and they are in agreement. >50% time spent on counselling and coordination of care.  CODE STATUS: Full code  TOTAL CRITICAL TIME TAKING CARE OF THIS PATIENT: 48 minutes.   POSSIBLE D/C IN 3-4 DAYS, DEPENDING ON CLINICAL CONDITION.   Demetrios Loll M.D on 07/21/2015 at 8:27 AM  Between 7am to 6pm - Pager - [phone]  After 6pm go to [email or web address] - password [email or web address] [place] [facility]  Office  [phone]  CC: Primary care physician; Dion Body, MD

## 2015-07-21 NOTE — Progress Notes (Signed)
0246 Rapid response and Dr. Jannifer Franklin called for increased confusion, restlessness with combativeness; BP/P elevated, and desat; afebrile;  0300 Pt. Transferred to CCU; report given to/accepted by Tess, CCU,RN

## 2015-07-21 NOTE — Care Management Important Message (Signed)
Important Message  Patient Details  Name: SHEFFIELD HAWKER MRN: [id number] Date of Birth: March 01, 1966   Medicare Important Message Given:  Yes-third notification given    Juliann Pulse A Allmond 07/21/2015, 10:27 AM

## 2015-07-21 NOTE — Progress Notes (Signed)
ANTICOAGULATION CONSULT NOTE - Follow Up Consult  Pharmacy Consult for Heparin Indication: pulmonary embolus  No Known Allergies  Patient Measurements: Height: 5\' 5"  (165.1 cm) (upon transfer) Weight: 180 lb 14.4 oz (82.056 kg) (upon transfer) IBW/kg (Calculated) : 61.5 Heparin Dosing Weight: 78.4 kg  Vital Signs: Temp: 97.5 F (36.4 C) (08/04 0800) Temp Source: Axillary (08/04 0800) BP: 124/79 mmHg (08/04 1300) Pulse Rate: 82 (08/04 1300)  Labs:  Recent Labs  07/19/15 0635 07/19/15 1302 07/20/15 0439 07/21/15 0742 07/21/15 1247  HGB 11.3*  --  11.8* 10.1*  --   HCT 35.6*  --  37.4* 31.3*  --   PLT 306  --  317 346  --   APTT  --   --   --   --  31  HEPARINUNFRC 0.71* 0.51 0.24*  --   --   CREATININE  --   --   --  1.00  --     Estimated Creatinine Clearance: 89.1 mL/min (by C-G formula based on Cr of 1).   Medications:  Scheduled:  . aspirin  162.5 mg Oral QHS  . budesonide (PULMICORT) nebulizer solution  0.5 mg Nebulization BID  . docusate sodium  100 mg Oral BID  . fenofibrate  160 mg Oral Daily  . insulin aspart  0-15 Units Subcutaneous 6 times per day  . insulin glargine  21 Units Subcutaneous QHS  . ipratropium-albuterol  3 mL Nebulization Q6H  . pravastatin  40 mg Oral QHS   Infusions:  . heparin 1,300 Units/hr (07/21/15 1504)   PRN: acetaminophen **OR** acetaminophen, azelastine, guaiFENesin, labetalol, ondansetron **OR** ondansetron (ZOFRAN) IV, oxyCODONE, sodium chloride  Assessment: 49 y/o M with BL PE patient previously converted to Eliquis, but is unable to tolerate oral medications at this time. Patient received last dose in evening on 8/3.    Goal of Therapy:  APTT: 68-109 Heparin level 0.3-0.7 units/ml Monitor platelets by anticoagulation protocol: Yes   Plan:   Will bolus heparin 4000 units iv and initiate infusion at 1300 units/hr. Will follow-up aPTT at 2100 and anti-Xa level with am labs. Will follow aPTT nomogram until anti-Xa  levels correlate with aPTT values.   Simpson,Michael L 07/21/2015,4:22 PM

## 2015-07-22 DIAGNOSIS — J9600 Acute respiratory failure, unspecified whether with hypoxia or hypercapnia: Secondary | ICD-10-CM

## 2015-07-22 DIAGNOSIS — G7111 Myotonic muscular dystrophy: Secondary | ICD-10-CM

## 2015-07-22 LAB — APTT
APTT: 84 s — AB (ref 24–36)
aPTT: 63 seconds — ABNORMAL HIGH (ref 24–36)

## 2015-07-22 LAB — CBC
HCT: 33.8 % — ABNORMAL LOW (ref 40.0–52.0)
HEMOGLOBIN: 10.8 g/dL — AB (ref 13.0–18.0)
MCH: 28 pg (ref 26.0–34.0)
MCHC: 32 g/dL (ref 32.0–36.0)
MCV: 87.4 fL (ref 80.0–100.0)
Platelets: 364 10*3/uL (ref 150–440)
RBC: 3.87 MIL/uL — ABNORMAL LOW (ref 4.40–5.90)
RDW: 15.3 % — ABNORMAL HIGH (ref 11.5–14.5)
WBC: 8.6 10*3/uL (ref 3.8–10.6)

## 2015-07-22 LAB — CULTURE, BLOOD (ROUTINE X 2)
CULTURE: NO GROWTH
Culture: NO GROWTH

## 2015-07-22 LAB — BASIC METABOLIC PANEL
Anion gap: 8 (ref 5–15)
BUN: 18 mg/dL (ref 6–20)
CALCIUM: 8.9 mg/dL (ref 8.9–10.3)
CHLORIDE: 106 mmol/L (ref 101–111)
CO2: 29 mmol/L (ref 22–32)
Creatinine, Ser: 0.87 mg/dL (ref 0.61–1.24)
GFR calc Af Amer: >60 mL/min (ref >60)
GFR calc non Af Amer: >60 mL/min (ref >60)
Glucose, Bld: 130 mg/dL — ABNORMAL HIGH (ref 65–99)
Potassium: 3.6 mmol/L (ref 3.5–5.1)
Sodium: 143 mmol/L (ref 135–145)

## 2015-07-22 LAB — BLOOD GAS, ARTERIAL
Acid-Base Excess: 2.6 mmol/L (ref 0.0–3.0)
BICARBONATE: 28.8 meq/L — AB (ref 21.0–28.0)
FIO2: 0.4
O2 Saturation: 90.4 %
PCO2 ART: 51 mmHg — AB (ref 32.0–48.0)
Patient temperature: 37
pH, Arterial: 7.36 (ref 7.350–7.450)
pO2, Arterial: 62 mmHg — ABNORMAL LOW (ref 83.0–108.0)

## 2015-07-22 LAB — GLUCOSE, CAPILLARY
GLUCOSE-CAPILLARY: 196 mg/dL — AB (ref 65–99)
GLUCOSE-CAPILLARY: 51 mg/dL — AB (ref 65–99)
Glucose-Capillary: 109 mg/dL — ABNORMAL HIGH (ref 65–99)
Glucose-Capillary: 137 mg/dL — ABNORMAL HIGH (ref 65–99)
Glucose-Capillary: 173 mg/dL — ABNORMAL HIGH (ref 65–99)
Glucose-Capillary: 224 mg/dL — ABNORMAL HIGH (ref 65–99)
Glucose-Capillary: 235 mg/dL — ABNORMAL HIGH (ref 65–99)
Glucose-Capillary: 98 mg/dL (ref 65–99)

## 2015-07-22 LAB — HEPARIN LEVEL (UNFRACTIONATED): Heparin Unfractionated: 1.79 IU/mL — ABNORMAL HIGH (ref 0.30–0.70)

## 2015-07-22 MED ORDER — ENSURE ENLIVE PO LIQD
237.0000 mL | Freq: Two times a day (BID) | ORAL | Status: DC
  Administered 2015-07-23 – 2015-07-25 (×4): 237 mL via ORAL

## 2015-07-22 MED ORDER — HEPARIN (PORCINE) IN NACL 100-0.45 UNIT/ML-% IJ SOLN
1900.0000 [IU]/h | INTRAMUSCULAR | Status: DC
  Administered 2015-07-22: 1600 [IU]/h via INTRAVENOUS
  Administered 2015-07-23 – 2015-07-24 (×2): 1500 [IU]/h via INTRAVENOUS
  Filled 2015-07-22 (×10): qty 250

## 2015-07-22 MED ORDER — DEXMEDETOMIDINE HCL IN NACL 400 MCG/100ML IV SOLN
0.4000 ug/kg/h | INTRAVENOUS | Status: DC
  Administered 2015-07-22: 0.4 ug/kg/h via INTRAVENOUS
  Filled 2015-07-22: qty 100

## 2015-07-22 MED ORDER — INSULIN ASPART 100 UNIT/ML ~~LOC~~ SOLN
2.0000 [IU] | Freq: Three times a day (TID) | SUBCUTANEOUS | Status: DC
  Administered 2015-07-23 – 2015-07-25 (×6): 2 [IU] via SUBCUTANEOUS
  Filled 2015-07-22 (×6): qty 2

## 2015-07-22 MED ORDER — IPRATROPIUM-ALBUTEROL 0.5-2.5 (3) MG/3ML IN SOLN
3.0000 mL | RESPIRATORY_TRACT | Status: DC
Start: 2015-07-22 — End: 2015-07-25
  Administered 2015-07-22 – 2015-07-25 (×19): 3 mL via RESPIRATORY_TRACT
  Filled 2015-07-22 (×19): qty 3

## 2015-07-22 MED ORDER — APIXABAN 5 MG PO TABS: 10.0000 mg | ORAL_TABLET | Freq: Two times a day (BID) | ORAL | Status: DC

## 2015-07-22 MED ORDER — CHLORHEXIDINE GLUCONATE 0.12% ORAL RINSE (MEDLINE KIT)
15.0000 mL | Freq: Two times a day (BID) | OROMUCOSAL | Status: DC
  Administered 2015-07-22 – 2015-07-25 (×6): 15 mL via OROMUCOSAL

## 2015-07-22 MED ORDER — INSULIN GLARGINE 100 UNIT/ML ~~LOC~~ SOLN
15.0000 [IU] | Freq: Every day | SUBCUTANEOUS | Status: DC
  Administered 2015-07-22 – 2015-07-24 (×3): 15 [IU] via SUBCUTANEOUS
  Filled 2015-07-22 (×4): qty 0.15

## 2015-07-22 MED ORDER — DEXMEDETOMIDINE HCL IN NACL 400 MCG/100ML IV SOLN
0.4000 ug/kg/h | INTRAVENOUS | Status: DC
  Administered 2015-07-22: 0.6 ug/kg/h via INTRAVENOUS
  Filled 2015-07-22 (×2): qty 100

## 2015-07-22 MED ORDER — CETYLPYRIDINIUM CHLORIDE 0.05 % MT LIQD
7.0000 mL | Freq: Two times a day (BID) | OROMUCOSAL | Status: DC
  Administered 2015-07-23 – 2015-07-24 (×4): 7 mL via OROMUCOSAL

## 2015-07-22 MED ORDER — DEXTROSE 50 % IV SOLN
25.0000 mL | INTRAVENOUS | Status: AC
  Administered 2015-07-22: 25 mL via INTRAVENOUS

## 2015-07-22 MED ORDER — DEXTROSE 50 % IV SOLN
INTRAVENOUS | Status: AC
  Administered 2015-07-22: 25 mL via INTRAVENOUS
  Filled 2015-07-22: qty 50

## 2015-07-22 NOTE — Care Management Note (Signed)
Case Management Note  Patient Details  Name: Bradley Bradley MRN: [id number] Date of Birth: [birth year]  Subjective/Objective:                  Met with patient and his wife to discuss discharge planning. Patient is very short of breath on high flow O2 at 40% currently only able to say 3-4 words at a time. I presented information about [facility] and [facility]. Patient said he "wants to go home". His wife is considering LTAC.  Action/Plan: List of home health agencies left with patient/wife along with LTAC information.RNCM to follow up with wife/patient. Santiago Glad with [facility] is screening for LTAC benefit. Patient/wife has not picked LTAC hospital.   Expected Discharge Date:                  Expected Discharge Plan:  Long Term Acute Care (LTAC)  In-House Referral:     Discharge planning Services  CM Consult  Post Acute Care Choice:    Choice offered to:  Patient, Spouse  DME Arranged:  Oxygen DME Agency:     HH Arranged:    HH Agency:     Status of Service:  In process, will continue to follow  Medicare Important Message Given:  Yes-third notification given Date Medicare IM Given:    Medicare IM give by:    Date Additional Medicare IM Given:    Additional Medicare Important Message give by:     If discussed at [place] of Stay Meetings, dates discussed:    Additional Comments:  Marshell Garfinkel, RN 07/22/2015, 10:55 AM

## 2015-07-22 NOTE — Progress Notes (Signed)
Pt unable to perform NIF

## 2015-07-22 NOTE — Progress Notes (Signed)
Initial Nutrition Assessment    INTERVENTION:   Meals and Snacks: Cater to patient preferences Medical Food Supplement Therapy: recommend Ensure Enlive po BID, each supplement provides 350 kcal and 20 grams of protein   NUTRITION DIAGNOSIS:   Inadequate oral intake related to acute illness as evidenced by  (inability to eat due to respiratory function).   GOAL:   Patient will meet greater than or equal to 90% of their needs   MONITOR:    (Energy Intake, Anthropoemtrics, Electrolyte/Renal Profile, Glucose Profile)  REASON FOR ASSESSMENT:   LOS    ASSESSMENT:    Pt admitted with acute respiratory failure due to bilateral PE and exacebaon of myotonic dystrophy; pt on HFNC, off BiPap; per Staci RN, pt hallucinating this AM  Past Medical History  Diagnosis Date  . Myotonic dystrophy   . Diabetes mellitus without complication     TYPE ONE     Diet Order:  DIET DYS 3 Room service appropriate?: Yes with Assist; Fluid consistency:: Thin   Energy Intake: recorded po intake 100% of meals, although nothing documented since 8/3. Per report, pt unable to eat much of anything yesterday due to respiratory status. Diet downgraded today to Dysphagia III from regular consistency  Food and Nutrition related History: unable to assess due to mentation currently  Nutrition Focused Physical Exam: Nutrition-Focused physical exam completed. Findings are WDL for fat depletion, muscle depletion, and edema.    Electrolyte and Renal Profile:  Recent Labs Lab 07/18/15 0326 07/21/15 0742 07/22/15 0327  BUN 18 19 18   CREATININE 1.02 1.00 0.87  NA 141 140 143  K 3.8 4.4 3.6   Glucose Profile:   Recent Labs  07/22/15 0352 07/22/15 0754 07/22/15 1118  GLUCAP 137* 98 235*   Protein Profile:   Recent Labs Lab 07/17/15 1239 07/18/15 0326  ALBUMIN 3.5 3.2*   Nutritional Anemia Profile:  CBC Latest Ref Rng 07/22/2015 07/21/2015 07/20/2015  WBC 3.8 - 10.6 K/uL 8.6 8.3 8.1   Hemoglobin 13.0 - 18.0 g/dL 10.8(L) 10.1(L) 11.8(L)  Hematocrit 40.0 - 52.0 % 33.8(L) 31.3(L) 37.4(L)  Platelets 150 - 440 K/uL 364 346 317    Meds: ss novolog, novolog with meals, lantus, colace  Height:   Ht Readings from Last 1 Encounters:  07/19/15 5\' 5"  (1.651 m)    Weight:   Wt Readings from Last 1 Encounters:  07/19/15 180 lb 14.4 oz (82.056 kg)   Filed Weights   07/17/15 1222 07/19/15 1436  Weight: 175 lb (79.379 kg) 180 lb 14.4 oz (82.056 kg)  '   BMI:  Body mass index is 30.1 kg/(m^2).  LOW Care Level  Kerman Passey MS, [place], LDN [phone] Pager

## 2015-07-22 NOTE — Progress Notes (Signed)
Subjective:   the patient feels better now he is more alert more weak sitting up eating applesauce no fever no chest pain no shortness of breath or cough  Objective:  Vital Signs in the last 24 hours: Temp:  [97.5 F (36.4 C)-98.3 F (36.8 C)] 98.2 F (36.8 C) (08/05 0800) Pulse Rate:  [72-115] 93 (08/05 1100) Resp:  [15-33] 24 (08/05 1100) BP: (71-181)/(52-112) 159/97 mmHg (08/05 1100) SpO2:  [87 %-99 %] 95 % (08/05 1202) FiO2 (%):  [40 %-50 %] 50 % (08/05 1202)  Intake/Output from previous day: 08/04 0701 - 08/05 0700 In: 749.8 [I.V.:199.8; IV Piggyback:550] Out: 840 [Urine:840] Intake/Output from this shift:    Physical Exam: General appearance: alert, cooperative, appears older than stated age, no distress and slowed mentation Neck: no adenopathy, no carotid bruit, no JVD, supple, symmetrical, trachea midline and thyroid not enlarged, symmetric, no tenderness/mass/nodules Lungs: diminished breath sounds bibasilar and bilaterally and rhonchi bibasilar and bilaterally Heart: regular rate and rhythm, S1, S2 normal, no murmur, click, rub or gallop and prominent apical impulse Abdomen: soft, non-tender; bowel sounds normal; no masses,  no organomegaly Extremities: extremities normal, atraumatic, no cyanosis or edema Pulses: 2+ and symmetric Skin: Skin color, texture, turgor normal. No rashes or lesions Neurologic: Alert and oriented X 3, normal strength and tone. Normal symmetric reflexes. Normal coordination and gait Mental status: Alert, oriented, thought content appropriate Gait: Abnormal  Lab Results:  Recent Labs  07/21/15 0742 07/22/15 0327  WBC 8.3 8.6  HGB 10.1* 10.8*  PLT 346 364    Recent Labs  07/21/15 0742 07/22/15 0327  NA 140 143  K 4.4 3.6  CL 105 106  CO2 28 29  GLUCOSE 330* 130*  BUN 19 18  CREATININE 1.00 0.87   No results for input(s): TROPONINI in the last 72 hours.  Invalid input(s): CK, MB Hepatic Function Panel No results for  input(s): PROT, ALBUMIN, AST, ALT, ALKPHOS, BILITOT, BILIDIR, IBILI in the last 72 hours. No results for input(s): CHOL in the last 72 hours. No results for input(s): PROTIME in the last 72 hours.  Imaging: Imaging results have been reviewed  Cardiac Studies:  Assessment/Plan:  Arrhythmia Atrial Fibrillation Palpitations Shortness of Breath   myotonic dystrophy  pulmonary embolus  deep venous thrombosis  hyperlipidemia  possible  aspiration pneumonia  diabetes type 2  history of hypertension now hypotensive . PLAN  continue ICU level care  supplemental oxygen as necessary  continue  Anticoagulation for pulmonary embolus  agree with swelling study for evaluation of possible aspiration  insulin therapy for diabetes  physical therapy for myotonic dystrophy  continue Pravachol for hyperlipidemia  antibiotics for possible aspiration pneumonia  rate control for arrhythmia and possible atrial fibrillation  LOS: 5 days    Bradley Bradley D. 07/22/2015, 12:13 PM

## 2015-07-22 NOTE — Progress Notes (Signed)
NIF -15. Pt did not have great coordination with performing task.

## 2015-07-22 NOTE — Care Management (Addendum)
Spoke with wife Lattie Haw about LTAC- she is still not sure about LTAC. She would like for [facility] rep to call her. I have notified [facility] of this request and they will contact Lattie Haw. Follow from [facility]: Wife Lattie Haw fears transfer at this time and mentioned that "(patient) could die". She would like to continue to think about it over the weekend.

## 2015-07-22 NOTE — Progress Notes (Signed)
Inpatient Diabetes Program Recommendations  AACE/ADA: New Consensus Statement on Inpatient Glycemic Control (2013)  Target Ranges:  Prepandial:   less than 140 mg/dL      Peak postprandial:   less than 180 mg/dL (1-2 hours)      Critically ill patients:  140 - 180 mg/dL   Reason for assessment: low blood sugar through the night  Outpatient Diabetes medications: Glucophage 1000mg  bid, Apidra 10-25 units bid based on a sliding scale  Current orders for Inpatient glycemic control: Lantus 15 units qhs, Novolog 0-15 units q4h   Patient has now been ordered a diet;   Consider Novolog sensitive correction scale 0-9 units tid and hs.    Consider adding mealtime insulin, Novolog 2 units tid with meals- hold if he eats less than 50%.   Gentry Fitz, RN, BA, MHA, CDE Diabetes Coordinator Inpatient Diabetes Program  [phone] (Team Pager) [phone] ([place]) 07/22/2015 11:19 AM

## 2015-07-22 NOTE — Progress Notes (Signed)
[facility] [place] NAME: Bradley Bradley    MR#:  [id number]  DATE OF BIRTH:  03-Sep-1966  SUBJECTIVE:  CHIEF COMPLAINT:   Chief Complaint  Patient presents with  . Weakness   The patient is on HF O2,  off BiPAP. SOB at rest, SAT at 88-90% REVIEW OF SYSTEMS:  CONSTITUTIONAL: No fever, but has weakness.  EYES: No blurred or double vision.  EARS, NOSE, AND THROAT: No tinnitus or ear pain.  RESPIRATORY: Has cough, has shortness of breath, no wheezing or hemoptysis.  CARDIOVASCULAR: No chest pain, orthopnea, edema.  GASTROINTESTINAL: No nausea, vomiting, diarrhea or abdominal pain. No melena or bloody stool. GENITOURINARY: No dysuria, hematuria.  ENDOCRINE: No polyuria, nocturia,  HEMATOLOGY: No anemia, easy bruising or bleeding SKIN: No rash or lesion. MUSCULOSKELETAL: No joint pain or arthritis.  NEUROLOGIC: No tingling, numbness, weakness.  PSYCHIATRY: No anxiety or depression.   DRUG ALLERGIES:  No Known Allergies  VITALS:  Blood pressure 159/97, pulse 93, temperature 98.2 F (36.8 C), temperature source Oral, resp. rate 24, height 5\' 5"  (1.651 m), weight 82.056 kg (180 lb 14.4 oz), SpO2 95 %.  PHYSICAL EXAMINATION:  GENERAL:  49 y.o.-year-old patient lying in the bed, lethargy.  EYES: Pupils equal, round, reactive to light. No scleral icterus. Extraocular muscles intact.  HEENT: Head atraumatic, normocephalic. NECK:  Supple, no jugular venous distention. No thyroid enlargement, no tenderness.  LUNGS: Normal breath sounds bilaterally, no wheezing. Mild crackles on left base with mild accessory muscles of respiration.  CARDIOVASCULAR: S1, S2 normal. No murmurs, rubs, or gallops.  ABDOMEN: Soft, nontender, nondistended. Bowel sounds present. No organomegaly or mass.  EXTREMITIES: No pedal edema, cyanosis, or clubbing.  NEUROLOGIC:  lethargy and on sedation.  PSYCHIATRIC: lethargy and on sedation.  SKIN:  No obvious rash, lesion, or ulcer.    LABORATORY PANEL:   CBC  Recent Labs Lab 07/22/15 0327  WBC 8.6  HGB 10.8*  HCT 33.8*  PLT 364   ------------------------------------------------------------------------------------------------------------------  Chemistries   Recent Labs Lab 07/18/15 0326  07/22/15 0327  NA 141  < > 143  K 3.8  < > 3.6  CL 109  < > 106  CO2 27  < > 29  GLUCOSE 116*  < > 130*  BUN 18  < > 18  CREATININE 1.02  < > 0.87  CALCIUM 8.7*  < > 8.9  AST 73*  --   --   ALT 75*  --   --   ALKPHOS 38  --   --   BILITOT 0.1*  --   --   < > = values in this interval not displayed. ------------------------------------------------------------------------------------------------------------------  Cardiac Enzymes  Recent Labs Lab 07/18/15 0805  TROPONINI 1.29*   ------------------------------------------------------------------------------------------------------------------  RADIOLOGY:  Dg Chest Port 1 View  07/21/2015   CLINICAL DATA:  Dyspnea and wheezing tonight  EXAM: PORTABLE CHEST - 1 VIEW  COMPARISON:  07/17/2015  FINDINGS: Ground-glass opacities are developing in the central lung regions bilaterally. This may represent alveolar edema. Infectious infiltrate is not excluded. There is no large effusion. There is unchanged right hemidiaphragm elevation.  IMPRESSION: Developing alveolar edema versus infectious infiltrates in the central lung regions bilaterally.   Electronically Signed   By: Andreas Newport M.D.   On: 07/21/2015 00:50    EKG:   Orders placed or performed during the hospital encounter of 07/17/15  . EKG 12-Lead  . EKG 12-Lead  . EKG 12-Lead  .  EKG 12-Lead    ASSESSMENT AND PLAN:   1. Acute respiratory failure with hypoxia secondary to bilateral submassive pulmonary embolism and concern that there is a component of neuromuscular resp failure.  Off BiPAP, Continue HF O2 with nebulizer treatment, follow-up critical care  physician.  Aspiration but no evidence of pneumonia,  Zosyn and vancomycin IV were discontinued and negative blood culture. Aspiration precaution. Per Speech Study staff, no dysphagia.  * bilateral submassive pulmonary embolism * Left leg DVT.  * moderate pulmonary hypertension  Changed to heparin drip and discontinued elquis, may change back to eliquis once out of ICU per Dr. Alva Garnet.  * Hypotension. possible due to sedation,  Improved after off sedation  2. Elevated troponin with right heart strain, changed to heparin drip and discontinued Eliquis.  echo EF 60-65%, moderate pulmonary hypertension Telemetry monitor show possible ST elevation. I discussed with the Dr. Clayborn Bigness, he suggested getting EKG but he doesn't think acute respiratory failure is cardiac related. EKG showed NSR with 1st degree block.  3. History of diabetes mellitus Sliding scale insulin and lantus, add novolog 2 unit AC.  4. History of myotonic dystrophy Not on any home medications. PT.  5. Hyperkalemia  Improved after Kayexalate 1    All the records are reviewed and case discussed with Care Management/Social Workerr. Management plans discussed with the patient, his wife and parentes, and they are in agreement. >50% time spent on counselling and coordination of care.  CODE STATUS: Full code  TOTAL CRITICAL TIME TAKING CARE OF THIS PATIENT: 52 minutes.   POSSIBLE D/C IN  ? DAYS, DEPENDING ON CLINICAL CONDITION.   Demetrios Loll M.D on 07/22/2015 at 12:42 PM  Between 7am to 6pm - Pager - [phone]  After 6pm go to [email or web address] - password [email or web address] [facility]  Office  [phone]  CC: Primary care physician; Dion Body, MD

## 2015-07-22 NOTE — Evaluation (Signed)
Clinical/Bedside Swallow Evaluation Patient Details  Name: Bradley Bradley MRN: [id number] Date of Birth: October 08, 1966  Today's Date: 07/22/2015 Time: SLP Start Time (ACUTE ONLY): 0800 SLP Stop Time (ACUTE ONLY): 0900 SLP Time Calculation (min) (ACUTE ONLY): 60 min  Past Medical History:  Past Medical History  Diagnosis Date  . Myotonic dystrophy   . Diabetes mellitus without complication     TYPE ONE   Past Surgical History:  Past Surgical History  Procedure Laterality Date  . Joint replacement    . Eye surgery    . Dental surgery     HPI:  Pt is a 49 y.o. 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 Patient was also feeling weak, dizzy and nauseous. Came into the ED CT and a gram of the chest has revealed bilateral submassive pulmonary embolism and elevated troponin. Pt is now on HFNC for O2 support. Pt is currently on a HFNC but has required other increased O2 support during admission sec. to decline in medical/respiratory status'. Pt is currently awake/alert, verbally conversive and wanting to eat breakfast.    Assessment / Plan / Recommendation Clinical Impression  Pt appears to adequately tolerate trials of thin liquids, purees, and mech soft trials w/ no overt s/s of aspiration noted. Pt gave a mild throat clear at end of all trials but this did not appear directly related to any po's given. Pt was strongly educated on general aspiration precautions and strongly rec'd to use small, single sips slowly when drinking as his respiratory effort w/ any exertion increases easily. Pt acknowledged this and agreed to slow down and take rest breaks w/ po's. No oral phase deficits noted, however, min. increased respiratory effort w/ the exertion of taking po's of increased texture was noted, and pt was given a rest  break. Pt appears able to tolerate iniation of an oral diet w/ strict aspiration precautions and monitoring during meals. Rec. a mech soft diet w/ puree foods included for easier intake. Rec. meds in puree w/ NSG. Rec. rest breaks during meals frequently. ST will f/u next 1-3 days.     Aspiration Risk  Mild (sec. to respiratory status; MD)    Diet Recommendation Dysphagia 3 (Mech soft);Thin   Medication Administration: Whole meds with puree Compensations: Slow rate;Small sips/bites (rest breaks)    Other  Recommendations Recommended Consults:  (Dietician) Oral Care Recommendations: Oral care BID;Oral care before and after PO;Staff/trained caregiver to provide oral care Other Recommendations:  (no large jug straw)   Follow Up Recommendations       Frequency and Duration min 3x week  1 week   Pertinent Vitals/Pain Denied any pain    SLP Swallow Goals  see care plan   Swallow Study Prior Functional Status   lived at home; wife assisted w/ ADLs    General Date of Onset: 07/17/15 Other Pertinent Information: Pt is a 49 y.o. 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 Patient was also feeling weak, dizzy and nauseous. Came into the ED CT and a gram of the chest has revealed bilateral submassive pulmonary embolism and elevated troponin. Pt is now on HFNC for O2 support.  Pt is currently on a HFNC but has required other increased O2 support during admission sec. to decline in medical/respiratory status'. Pt is currently awake/alert, verbally conversive and wanting to eat breakfast.  Type of Study: Bedside swallow evaluation Previous Swallow Assessment: none Diet Prior to this Study: Regular;Thin liquids (prior to admission) Temperature Spikes Noted: No (wbc 8.1-8.6 in past 3 days) Respiratory Status:   (HFNC) History of Recent Intubation: No Behavior/Cognition: Alert;Cooperative;Pleasant mood;Distractible;Requires cueing (talks frequently) Oral Cavity - Dentition: Adequate natural dentition/normal for age Self-Feeding Abilities: Able to feed self;Needs set up Patient Positioning: Upright in bed Baseline Vocal Quality: Normal Volitional Cough: Weak (min.) Volitional Swallow: Able to elicit    Oral/Motor/Sensory Function Overall Oral Motor/Sensory Function: Appears within functional limits for tasks assessed Labial ROM: Within Functional Limits Labial Symmetry: Within Functional Limits Labial Strength: Within Functional Limits Lingual ROM: Within Functional Limits Lingual Symmetry: Within Functional Limits Lingual Strength: Within Functional Limits Facial Symmetry: Within Functional Limits Mandible: Within Functional Limits   Ice Chips Ice chips: Within functional limits Presentation: Spoon (fed; x5)   Thin Liquid Thin Liquid: Within functional limits Presentation: Cup;Self Fed (assisted when nec.; x10 trials)    Nectar Thick Nectar Thick Liquid: Not tested   Honey Thick Honey Thick Liquid: Not tested   Puree Puree: Within functional limits Presentation: Spoon (fed; x5 trials)   Solid   GO    Solid: Within functional limits Presentation: Self Fed (x3 trials)      Bradley Kenner, MS, CCC-SLP  Jenni Thew 07/22/2015,2:12 PM

## 2015-07-22 NOTE — Progress Notes (Signed)
ANTICOAGULATION CONSULT NOTE - Follow Up Consult  Pharmacy Consult for Heparin Indication: pulmonary embolus  No Known Allergies  Patient Measurements: Height: 5\' 5"  (165.1 cm) (upon transfer) Weight: 180 lb 14.4 oz (82.056 kg) (upon transfer) IBW/kg (Calculated) : 61.5 Heparin Dosing Weight: 78.4 kg  Vital Signs: Temp: 98.2 F (36.8 C) (08/05 0800) Temp Source: Oral (08/05 0800) BP: 159/97 mmHg (08/05 1100) Pulse Rate: 93 (08/05 1100)  Labs:  Recent Labs  07/20/15 0439 07/21/15 0742  07/21/15 2203 07/22/15 0327 07/22/15 1233  HGB 11.8* 10.1*  --   --  10.8*  --   HCT 37.4* 31.3*  --   --  33.8*  --   PLT 317 346  --   --  364  --   APTT  --   --   < > 65* 63* 84*  HEPARINUNFRC 0.24*  --   --   --  1.79*  --   CREATININE  --  1.00  --   --  0.87  --   < > = values in this interval not displayed.  Estimated Creatinine Clearance: 102.4 mL/min (by C-G formula based on Cr of 0.87).   Medications:  Scheduled:  . antiseptic oral rinse  7 mL Mouth Rinse BID  . aspirin  162.5 mg Oral QHS  . budesonide (PULMICORT) nebulizer solution  0.5 mg Nebulization BID  . docusate sodium  100 mg Oral BID  . feeding supplement (ENSURE ENLIVE)  237 mL Oral BID BM  . fenofibrate  160 mg Oral Daily  . insulin aspart  0-15 Units Subcutaneous 6 times per day  . insulin aspart  2 Units Subcutaneous TID WC  . insulin glargine  15 Units Subcutaneous QHS  . ipratropium-albuterol  3 mL Nebulization Q4H  . pravastatin  40 mg Oral QHS   Infusions:  . heparin 1,600 Units/hr (07/22/15 1105)   PRN: acetaminophen **OR** acetaminophen, azelastine, guaiFENesin, labetalol, ondansetron **OR** ondansetron (ZOFRAN) IV, oxyCODONE, sodium chloride  Assessment: 49 y/o M with BL PE patient previously converted to Eliquis, but is unable to tolerate oral medications at this time. Patient received last dose in evening on 8/3.  APTT at 1233 on 8/5: 84   Goal of Therapy:  APTT: 68-109 Heparin level  0.3-0.7 units/ml Monitor platelets by anticoagulation protocol: Yes   Plan:  APTT is within desired range.  Will order next aPTT and HL to be drawn with AM labs.  CBC ordered in AM.   Pharmacy will continue to follow.  Bradley Bradley G 07/22/2015,2:33 PM

## 2015-07-22 NOTE — Progress Notes (Signed)
PT Cancellation Note  Patient Details Name: Bradley Bradley MRN: [id number] DOB: 07-27-1966   Cancelled Treatment:    Reason Eval/Treat Not Completed:  (See PT note for further details) PT chart reviewed this AM. Pt pulmonary function doing worse. Discussed with nurse and she agreed that holding is most appropriate. Will attempt at later time/date.    Janyth Contes 07/22/2015, 10:57 AM  Janyth Contes, SPT. [phone]

## 2015-07-22 NOTE — Progress Notes (Signed)
Denies new complaint. No chest pain or rest dyspnea. No overt distress. Remains on high flow [place] O2  Filed Vitals:   07/22/15 0505 07/22/15 0600 07/22/15 0614 07/22/15 0740  BP: 154/82 177/112 131/105   Pulse: 92 95 95   Temp:      TempSrc:      Resp: 20 21 24    Height:      Weight:      SpO2: 93% 92% 94% 94%   HEENT unchanged No JVD noted Reg, no M Chest clear anteriorly NABS, soft Ext without edema  BMET    Component Value Date/Time   NA 143 07/22/2015 0327   NA 141 01/21/2012 0623   K 3.6 07/22/2015 0327   K 4.6 01/21/2012 0623   CL 106 07/22/2015 0327   CL 106 01/21/2012 0623   CO2 29 07/22/2015 0327   CO2 27 01/21/2012 0623   GLUCOSE 130* 07/22/2015 0327   GLUCOSE 191* 01/21/2012 0623   BUN 18 07/22/2015 0327   BUN 23* 01/21/2012 0623   CREATININE 0.87 07/22/2015 0327   CREATININE 0.89 01/21/2012 0623   CALCIUM 8.9 07/22/2015 0327   CALCIUM 9.3 01/21/2012 0623   GFRNONAA >60 07/22/2015 0327   GFRAA >60 07/22/2015 0327    CBC    Component Value Date/Time   WBC 8.6 07/22/2015 0327   WBC 8.4 01/21/2012 0623   RBC 3.87* 07/22/2015 0327   RBC 4.45 01/21/2012 0623   HGB 10.8* 07/22/2015 0327   HGB 13.1 01/21/2012 0623   HCT 33.8* 07/22/2015 0327   HCT 40.1 01/21/2012 0623   PLT 364 07/22/2015 0327   PLT 226 01/21/2012 0623   MCV 87.4 07/22/2015 0327   MCV 90 01/21/2012 0623   MCH 28.0 07/22/2015 0327   MCH 29.5 01/21/2012 0623   MCHC 32.0 07/22/2015 0327   MCHC 32.7 01/21/2012 0623   RDW 15.3* 07/22/2015 0327   RDW 14.4 01/21/2012 0623    No new CXR  IMPRESSION: Very large PE Recurrent acute respiratory failure - concern that there is a component of neuromuscular resp failure Myotonic dystrophy DM2 with labile CBGs (both high and low)  PLAN/REC: Cont IV UFH Change back to oral agent once he is out if ICU/SDU Advance diet and activity as tolerated Mgmt of DM per primary team  It is reasonable to keep him in ICU/SDU at least through today  for closer nursing monitoring   Merton Border, MD ; [facility] service Mobile [phone].  After 5:30 PM or weekends, call [phone]

## 2015-07-22 NOTE — Progress Notes (Signed)
ANTICOAGULATION CONSULT NOTE - Follow Up Consult  Pharmacy Consult for Heparin Indication: pulmonary embolus  No Known Allergies  Patient Measurements: Height: 5\' 5"  (165.1 cm) (upon transfer) Weight: 180 lb 14.4 oz (82.056 kg) (upon transfer) IBW/kg (Calculated) : 61.5 Heparin Dosing Weight: 78.4 kg  Vital Signs: Temp: 98.3 F (36.8 C) (08/05 0353) Temp Source: Oral (08/05 0353) BP: 154/82 mmHg (08/05 0505) Pulse Rate: 92 (08/05 0505)  Labs:  Recent Labs  07/19/15 0635 07/19/15 1302 07/20/15 0439 07/21/15 0742 07/21/15 1247 07/21/15 2203 07/22/15 0327  HGB 11.3*  --  11.8* 10.1*  --   --  10.8*  HCT 35.6*  --  37.4* 31.3*  --   --  33.8*  PLT 306  --  317 346  --   --  364  APTT  --   --   --   --  31 65* 63*  HEPARINUNFRC 0.71* 0.51 0.24*  --   --   --   --   CREATININE  --   --   --  1.00  --   --  0.87    Estimated Creatinine Clearance: 102.4 mL/min (by C-G formula based on Cr of 0.87).   Medications:  Scheduled:  . antiseptic oral rinse  7 mL Mouth Rinse BID  . aspirin  162.5 mg Oral QHS  . budesonide (PULMICORT) nebulizer solution  0.5 mg Nebulization BID  . docusate sodium  100 mg Oral BID  . fenofibrate  160 mg Oral Daily  . insulin aspart  0-15 Units Subcutaneous 6 times per day  . insulin glargine  21 Units Subcutaneous QHS  . ipratropium-albuterol  3 mL Nebulization Q6H  . pravastatin  40 mg Oral QHS   Infusions:  . heparin 1,600 Units/hr (07/22/15 0546)   PRN: acetaminophen **OR** acetaminophen, azelastine, guaiFENesin, labetalol, ondansetron **OR** ondansetron (ZOFRAN) IV, oxyCODONE, sodium chloride  Assessment: 49 y/o M with BL PE patient previously converted to Eliquis, but is unable to tolerate oral medications at this time. Patient received last dose in evening on 8/3.   Goal of Therapy:  APTT: 68-109 Heparin level 0.3-0.7 units/ml Monitor platelets by anticoagulation protocol: Yes   Plan:  APTT remains below goal and not increasing  after last heparin rate increase. Will increase heparin by 2 ml/hr to 1600 units/hr and recheck APTT in 6 hours.   Ulice Dash D 07/22/2015,5:45 AM

## 2015-07-22 NOTE — Plan of Care (Signed)
Problem: SLP Dysphagia Goals Goal: Misc Dysphagia Goal Pt will safely tolerate po diet of least restrictive consistency w/ no overt s/s of aspiration noted by Staff/pt/family x3 sessions.    

## 2015-07-22 NOTE — Progress Notes (Signed)
Subjective:   lethargic unresponsive   Acute mental status change with  moderate shortness of breath  Objective:  Vital Signs in the last 24 hours: Temp:  [97.5 F (36.4 C)-98.3 F (36.8 C)] 98.2 F (36.8 C) (08/05 0800) Pulse Rate:  [72-115] 93 (08/05 1100) Resp:  [15-33] 24 (08/05 1100) BP: (71-181)/(52-112) 159/97 mmHg (08/05 1100) SpO2:  [87 %-99 %] 95 % (08/05 1202) FiO2 (%):  [40 %-50 %] 50 % (08/05 1202)  Intake/Output from previous day: 08/04 0701 - 08/05 0700 In: 749.8 [I.V.:199.8; IV Piggyback:550] Out: 840 [Urine:840] Intake/Output from this shift:    Physical Exam: General appearance: Lethargic unresponsive altered mental status Neck: no adenopathy, no carotid bruit, no JVD, supple, symmetrical, trachea midline and thyroid not enlarged, symmetric, no tenderness/mass/nodules Lungs: diminished breath sounds bibasilar and bilaterally and rhonchi bibasilar and bilaterally Heart: regular rate and rhythm, S1, S2 normal, no murmur, click, rub or gallop Abdomen: soft, non-tender; bowel sounds normal; no masses,  no organomegaly Extremities: extremities normal, atraumatic, no cyanosis or edema Pulses: 2+ and symmetric Skin: Skin color, texture, turgor normal. No rashes or lesions Neurologic: Mental status: Alert, oriented, thought content appropriate, Lethargic disoriented Motor: Generalized weakness  Lab Results:  Recent Labs  07/21/15 0742 07/22/15 0327  WBC 8.3 8.6  HGB 10.1* 10.8*  PLT 346 364    Recent Labs  07/21/15 0742 07/22/15 0327  NA 140 143  K 4.4 3.6  CL 105 106  CO2 28 29  GLUCOSE 330* 130*  BUN 19 18  CREATININE 1.00 0.87   No results for input(s): TROPONINI in the last 72 hours.  Invalid input(s): CK, MB Hepatic Function Panel No results for input(s): PROT, ALBUMIN, AST, ALT, ALKPHOS, BILITOT, BILIDIR, IBILI in the last 72 hours. No results for input(s): CHOL in the last 72 hours. No results for input(s): PROTIME in the last 72  hours.  Imaging: Imaging results have been reviewed  Cardiac Studies:  Assessment/Plan:  Arrhythmia Atrial Fibrillation Hypotension/Shock Palpitations Shortness of Breath Aspiration pneumonia   diabetes  hypertension   Hyperlipidemia . PLAN  continue rest a support  continue supplemental oxygen therapy  broad-spectrum antibiotic therapy  speech  consult for swallowing  Possible aspiration  diabetes continue insulin therapy is necessary  volume expansion for hypotension  continue anticoagulation for pulmonary embolus  agree with Pravachol for hyperlipidemia   LOS: 5 days    Bradley Bradley D. 07/22/2015, 12:21 PM

## 2015-07-23 ENCOUNTER — Inpatient Hospital Stay: Payer: Medicare Other

## 2015-07-23 LAB — CBC
HCT: 30.7 % — ABNORMAL LOW (ref 40.0–52.0)
HEMOGLOBIN: 10.1 g/dL — AB (ref 13.0–18.0)
MCH: 28.7 pg (ref 26.0–34.0)
MCHC: 33 g/dL (ref 32.0–36.0)
MCV: 87 fL (ref 80.0–100.0)
Platelets: 403 10*3/uL (ref 150–440)
RBC: 3.52 MIL/uL — ABNORMAL LOW (ref 4.40–5.90)
RDW: 15.2 % — AB (ref 11.5–14.5)
WBC: 6.7 10*3/uL (ref 3.8–10.6)

## 2015-07-23 LAB — MAGNESIUM: Magnesium: 2.5 mg/dL — ABNORMAL HIGH (ref 1.7–2.4)

## 2015-07-23 LAB — GLUCOSE, CAPILLARY
GLUCOSE-CAPILLARY: 140 mg/dL — AB (ref 65–99)
GLUCOSE-CAPILLARY: 186 mg/dL — AB (ref 65–99)
Glucose-Capillary: 111 mg/dL — ABNORMAL HIGH (ref 65–99)
Glucose-Capillary: 163 mg/dL — ABNORMAL HIGH (ref 65–99)
Glucose-Capillary: 201 mg/dL — ABNORMAL HIGH (ref 65–99)

## 2015-07-23 LAB — HEPARIN LEVEL (UNFRACTIONATED)
HEPARIN UNFRACTIONATED: 1.06 [IU]/mL — AB (ref 0.30–0.70)
HEPARIN UNFRACTIONATED: 0.81 [IU]/mL — AB (ref 0.30–0.70)

## 2015-07-23 LAB — APTT
APTT: 114 s — AB (ref 24–36)
APTT: 74 s — AB (ref 24–36)

## 2015-07-23 LAB — BASIC METABOLIC PANEL
ANION GAP: 4 — AB (ref 5–15)
BUN: 18 mg/dL (ref 6–20)
CHLORIDE: 109 mmol/L (ref 101–111)
CO2: 32 mmol/L (ref 22–32)
Calcium: 8.9 mg/dL (ref 8.9–10.3)
Creatinine, Ser: 0.7 mg/dL (ref 0.61–1.24)
GFR calc Af Amer: >60 mL/min (ref >60)
GFR calc non Af Amer: >60 mL/min (ref >60)
GLUCOSE: 121 mg/dL — AB (ref 65–99)
POTASSIUM: 4.3 mmol/L (ref 3.5–5.1)
Sodium: 145 mmol/L (ref 135–145)

## 2015-07-23 MED ORDER — GUAIFENESIN 100 MG/5ML PO SYRP
200.0000 mg | ORAL_SOLUTION | ORAL | Status: DC | PRN
  Administered 2015-07-23 (×3): 200 mg via ORAL
  Filled 2015-07-23 (×4): qty 10

## 2015-07-23 NOTE — Progress Notes (Signed)
ANTICOAGULATION CONSULT NOTE - Follow Up Consult  Pharmacy Consult for Heparin Indication: pulmonary embolus  No Known Allergies  Patient Measurements: Height: 5\' 5"  (165.1 cm) (upon transfer) Weight: 180 lb 14.4 oz (82.056 kg) (upon transfer) IBW/kg (Calculated) : 61.5 Heparin Dosing Weight: 78.4 kg  Vital Signs: Temp: 98.3 F (36.8 C) (08/06 0400) Temp Source: Axillary (08/06 0400) BP: 93/60 mmHg (08/06 0602) Pulse Rate: 60 (08/06 0602)  Labs:  Recent Labs  07/21/15 0742  07/22/15 0327 07/22/15 1233 07/23/15 0606  HGB 10.1*  --  10.8*  --  10.1*  HCT 31.3*  --  33.8*  --  30.7*  PLT 346  --  364  --  403  APTT  --   < > 63* 84* 114*  HEPARINUNFRC  --   --  1.79*  --  1.06*  CREATININE 1.00  --  0.87  --  0.70  < > = values in this interval not displayed.  Estimated Creatinine Clearance: 111.3 mL/min (by C-G formula based on Cr of 0.7).   Medications:  Scheduled:  . antiseptic oral rinse  7 mL Mouth Rinse q12n4p  . aspirin  162.5 mg Oral QHS  . budesonide (PULMICORT) nebulizer solution  0.5 mg Nebulization BID  . chlorhexidine gluconate  15 mL Mouth Rinse BID  . docusate sodium  100 mg Oral BID  . feeding supplement (ENSURE ENLIVE)  237 mL Oral BID BM  . fenofibrate  160 mg Oral Daily  . insulin aspart  0-15 Units Subcutaneous 6 times per day  . insulin aspart  2 Units Subcutaneous TID WC  . insulin glargine  15 Units Subcutaneous QHS  . ipratropium-albuterol  3 mL Nebulization Q4H  . pravastatin  40 mg Oral QHS   Infusions:  . dexmedetomidine 0.3 mcg/kg/hr (07/23/15 0212)  . heparin 1,600 Units/hr (07/22/15 2310)   PRN: acetaminophen **OR** acetaminophen, azelastine, guaiFENesin, labetalol, ondansetron **OR** ondansetron (ZOFRAN) IV, oxyCODONE, sodium chloride  Assessment: 49 y/o M with BL PE patient previously converted to Eliquis, but is unable to tolerate oral medications at this time. Patient received last dose in evening on 8/3. APTT and HL are  both above goal but getting closer to correlating.   Goal of Therapy:  APTT: 68-109 Heparin level 0.3-0.7 units/ml Monitor platelets by anticoagulation protocol: Yes   Plan:  Will decrease heparin infusion to 1500 units/hr and recheck APTT and HL in 6 hours.   Ulice Dash D 07/23/2015,7:05 AM

## 2015-07-23 NOTE — Progress Notes (Signed)
Pt unable to perform NIF

## 2015-07-23 NOTE — Consult Note (Signed)
PULMONARY/CCM CONSULT NOTE  Requesting MD/Service: Internal Medicine Date of admission: 7/31 Date of consult: 8/01 Reason for consultation: PE  CC: follow up SOB HPI:  Patient more alert and awake today, less WOB, wife at bedside I placed on precedex and biPAP last night which seemed to have helped resp status.   Patient still SOB, poor cough reflex, obtain NIF today   Past Medical History  Diagnosis Date  . Myotonic dystrophy   . 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 Marital Status: Married    Spouse Name: N/A  . Number of Children: N/A  . Years of Education: N/A   Occupational History  . Not on file.   Social History Main Topics  . Smoking status: Former Research scientist (life sciences)  . Smokeless tobacco: Not on file  . Alcohol Use: Yes     Comment: occasionally  . Drug Use: Not on file  . Sexual Activity: Not on file   Other Topics Concern  . Not on file   Social History Narrative    Family History  Problem Relation Age of Onset  . Pulmonary embolism Brother     ROS - Review of Systems  Constitutional: Positive for malaise/fatigue and diaphoresis. Negative for fever, chills and weight loss.  HENT: Negative for hearing loss and tinnitus.   Eyes: Negative for blurred vision, double vision and photophobia.  Respiratory: Positive for shortness of breath and wheezing. Negative for cough.   Gastrointestinal: Negative for heartburn, nausea and vomiting.  Genitourinary: Negative for dysuria and urgency.  Musculoskeletal: Negative for myalgias and neck pain.  Skin: Negative for itching and rash.  Neurological: Positive for weakness. Negative for dizziness, tingling and headaches.  Endo/Heme/Allergies: Does not bruise/bleed easily.  Psychiatric/Behavioral: Negative for depression, suicidal ideas and substance abuse.     Filed Vitals:   07/23/15 0500 07/23/15 0602 07/23/15 0739 07/23/15 0754  BP: 95/69 93/60     Pulse: 63 60    Temp:    97.6 F (36.4 C)  TempSrc:    Axillary  Resp: 16 12    Height:      Weight:      SpO2: 99% 100% 100%    Physical Examination:   VS: BP 93/60 mmHg  Pulse 60  Temp(Src) 97.6 F (36.4 C) (Axillary)  Resp 12  Ht 5\' 5"  (1.651 m)  Wt 180 lb 14.4 oz (82.056 kg)  BMI 30.10 kg/m2  SpO2 100%  General Appearance: resp distress Neuro:without focal findings, mental status, speech-soft spoken, lethargic,  sensation grossly normal  2-3/5 muscle weakness UE HEENT: PERRLA, EOM intact, no ptosis, no other lesions noticed;  Pulmonary: normal breath sounds., diaphragmatic excursion abnormal, No rales;      CardiovascularNormal S1,S2.  No m/r/g.   Abdomen: Benign, Soft, non-tender, No masses, hepatosplenomegaly, No lymphadenopathy Renal:  No costovertebral tenderness  GU:  No performed at this time. Endoc: No evident thyromegaly, no signs of acromegaly or Cushing features Skin:   warm, no rashes, no ecchymosis  Extremities: normal, no cyanosis, clubbing, no edema, warm with normal capillary refill.   DATA:  BMET    Component Value Date/Time   NA 145 07/23/2015 0606   NA 141 01/21/2012 0623   K 4.3 07/23/2015 0606   K 4.6 01/21/2012 0623   CL 109 07/23/2015 0606   CL 106 01/21/2012 0623   CO2 32 07/23/2015 0606   CO2 27 01/21/2012 8325  GLUCOSE 121* 07/23/2015 0606   GLUCOSE 191* 01/21/2012 0623   BUN 18 07/23/2015 0606   BUN 23* 01/21/2012 0623   CREATININE 0.70 07/23/2015 0606   CREATININE 0.89 01/21/2012 0623   CALCIUM 8.9 07/23/2015 0606   CALCIUM 9.3 01/21/2012 0623   GFRNONAA >60 07/23/2015 0606   GFRAA >60 07/23/2015 0606    CBC    Component Value Date/Time   WBC 6.7 07/23/2015 0606   WBC 8.4 01/21/2012 0623   RBC 3.52* 07/23/2015 0606   RBC 4.45 01/21/2012 0623   HGB 10.1* 07/23/2015 0606   HGB 13.1 01/21/2012 0623   HCT 30.7* 07/23/2015 0606   HCT 40.1 01/21/2012 0623   PLT 403 07/23/2015 0606   PLT 226 01/21/2012 0623   MCV 87.0  07/23/2015 0606   MCV 90 01/21/2012 0623   MCH 28.7 07/23/2015 0606   MCH 29.5 01/21/2012 0623   MCHC 33.0 07/23/2015 0606   MCHC 32.7 01/21/2012 0623   RDW 15.2* 07/23/2015 0606   RDW 14.4 01/21/2012 0623   CXR: mild bibasilar atelectasis CT chest: large bilateral PE. Dilated RV EKG: No S1Q3T3 pattern. NSST changes LE venous Doppler scans: small residual DVT on L  IMPRESSION:    49 yo white male with acute B/l PE with underlying muscle dystrophy now with hypoxic and hypercapnic resp failure Patient at high risk for intubation   1.Respiratory Failure-from PE and exacebation of Myotonic dystrophy -continue biPAP and High flow [place] as tolerated -will continue precedex and BiPAP at night again tonight -continue Bronchodilator Therapy -check NIF -speech therapy-dysphagia 3 diet with thin liquids -no need for abx/steroids at this time  2.PE-start heparin infusion 3.DM-  Family updated and notified of poor situation  I have personally obtained a history, examined the patient, evaluated Pertinent laboratory and [employer]/imaging results, and  formulated the assessment and plan   The Patient requires high complexity decision making for assessment and support, frequent evaluation and titration of therapies, application of advanced monitoring technologies and extensive interpretation of multiple databases. Critical Care Time devoted to patient care services described in this note is 45 minutes.   Overall, patient is critically ill, prognosis is guarded.  Still at high risk for intubation   Corrin Parker, M.D.  [facility]  Medical Director [place] Director [facility] Cardio-Pulmonary Department

## 2015-07-23 NOTE — Progress Notes (Signed)
   07/23/15 0900  Clinical Encounter Type  Visited With Patient and family together  Visit Type Spiritual support  Referral From Nurse  Consult/Referral To Chaplain  Spiritual Encounters  Spiritual Needs Prayer;Emotional  Stress Factors  Patient Stress Factors Health changes;Exhausted  Family Stress Factors Exhausted;Health changes  Met w/patient & spouse. Provided spiritual counsel, pastoral care, and prayer. Chap. Oveta Idris G. [place]

## 2015-07-23 NOTE — Progress Notes (Addendum)
Pt alert and oriented. Precedex off since this am- Per Dr. Mortimer Fries- keep patient off precedex during the day and place him on drip during night for next 2 nights- Pt given cough medicine- no complaints of pain- urine output adequate. Per Dr.Kasa- only given 2 units of inulin if patient eating- it patient not eating- follow sliding scale for additional order. Family at bedside.  Pt resting at this time.

## 2015-07-23 NOTE — Progress Notes (Signed)
ANTICOAGULATION CONSULT NOTE - Follow Up Consult  Pharmacy Consult for Heparin Indication: pulmonary embolus  No Known Allergies  Patient Measurements: Height: 5\' 5"  (165.1 cm) (upon transfer) Weight: 180 lb 14.4 oz (82.056 kg) (upon transfer) IBW/kg (Calculated) : 61.5 Heparin Dosing Weight: 78.4 kg  Vital Signs: Temp: 98.7 F (37.1 C) (08/06 1142) Temp Source: Oral (08/06 1142) BP: 93/57 mmHg (08/06 0700) Pulse Rate: 60 (08/06 0700)  Labs:  Recent Labs  07/21/15 0742  07/22/15 0327 07/22/15 1233 07/23/15 0606 07/23/15 1301  HGB 10.1*  --  10.8*  --  10.1*  --   HCT 31.3*  --  33.8*  --  30.7*  --   PLT 346  --  364  --  403  --   APTT  --   < > 63* 84* 114* 74*  HEPARINUNFRC  --   --  1.79*  --  1.06* 0.81*  CREATININE 1.00  --  0.87  --  0.70  --   < > = values in this interval not displayed.  Estimated Creatinine Clearance: 111.3 mL/min (by C-G formula based on Cr of 0.7).   Medications:  Scheduled:  . antiseptic oral rinse  7 mL Mouth Rinse q12n4p  . aspirin  162.5 mg Oral QHS  . budesonide (PULMICORT) nebulizer solution  0.5 mg Nebulization BID  . chlorhexidine gluconate  15 mL Mouth Rinse BID  . docusate sodium  100 mg Oral BID  . feeding supplement (ENSURE ENLIVE)  237 mL Oral BID BM  . fenofibrate  160 mg Oral Daily  . insulin aspart  0-15 Units Subcutaneous 6 times per day  . insulin aspart  2 Units Subcutaneous TID WC  . insulin glargine  15 Units Subcutaneous QHS  . ipratropium-albuterol  3 mL Nebulization Q4H  . pravastatin  40 mg Oral QHS   Infusions:  . dexmedetomidine 0.3 mcg/kg/hr (07/23/15 0212)  . heparin 1,500 Units/hr (07/23/15 0729)   PRN: acetaminophen **OR** acetaminophen, azelastine, guaiFENesin, guaifenesin, labetalol, ondansetron **OR** ondansetron (ZOFRAN) IV, oxyCODONE, sodium chloride  Assessment: 49 y/o M with BL PE patient previously converted to Eliquis, but is unable to tolerate oral medications at this time. Patient  received last dose in evening on 8/3.  APTT therapeutic   Goal of Therapy:  APTT: 68-109 Heparin level 0.3-0.7 units/ml Monitor platelets by anticoagulation protocol: Yes   Plan:  Will continue current rate of 1500 units/hr, recheck aPTT, HL, and CBC with AM labs.  Pharmacy to follow per consult  Rexene Edison, PharmD Clinical Pharmacist  07/23/2015,1:51 PM

## 2015-07-23 NOTE — Progress Notes (Signed)
[place] [facility] [place] NAME: Bradley Bradley    MR#:  [id number]  DATE OF BIRTH:  04-15-1966  SUBJECTIVE:  CHIEF COMPLAINT:   Chief Complaint  Patient presents with  . Weakness   The patient is on HF O2,  off BiPAP. Was on bipap with precedex drip, feels much better this morning. On 40 % HFNC  REVIEW OF SYSTEMS:  CONSTITUTIONAL: No fever, but has weakness.  EYES: No blurred or double vision.  EARS, NOSE, AND THROAT: No tinnitus or ear pain.  RESPIRATORY: Has cough, has shortness of breath, no wheezing or hemoptysis.  CARDIOVASCULAR: No chest pain, orthopnea, edema.  GASTROINTESTINAL: No nausea, vomiting, diarrhea or abdominal pain. No melena or bloody stool. GENITOURINARY: No dysuria, hematuria.  ENDOCRINE: No polyuria, nocturia,  HEMATOLOGY: No anemia, easy bruising or bleeding SKIN: No rash or lesion. MUSCULOSKELETAL: No joint pain or arthritis.  NEUROLOGIC: No tingling, numbness, weakness.  PSYCHIATRY: No anxiety or depression.   DRUG ALLERGIES:  No Known Allergies  VITALS:  Blood pressure 93/57, pulse 60, temperature 97.6 F (36.4 C), temperature source Axillary, resp. rate 12, height 5\' 5"  (1.651 m), weight 82.056 kg (180 lb 14.4 oz), SpO2 100 %.  PHYSICAL EXAMINATION:  GENERAL:  49 y.o.-year-old patient lying in the bed,no active distress. EYES: Pupils equal, round, reactive to light. No scleral icterus. Extraocular muscles intact.  HEENT: Head atraumatic, normocephalic. NECK:  Supple, no jugular venous distention. No thyroid enlargement, no tenderness.  LUNGS: Normal breath sounds bilaterally, no wheezing. Mild crackles on left base with no use of accessory muscles of respiration.  CARDIOVASCULAR: S1, S2 normal. No murmurs, rubs, or gallops.  ABDOMEN: Soft, nontender, nondistended. Bowel sounds present. No organomegaly or mass.  EXTREMITIES: No pedal edema, cyanosis, or clubbing.  NEUROLOGIC: follows  commands, moves all 4 limbs- power 5/5. PSYCHIATRIC:alert and oriented x 3.  SKIN: No obvious rash, lesion, or ulcer.    LABORATORY PANEL:   CBC  Recent Labs Lab 07/23/15 0606  WBC 6.7  HGB 10.1*  HCT 30.7*  PLT 403   ------------------------------------------------------------------------------------------------------------------  Chemistries   Recent Labs Lab 07/18/15 0326  07/23/15 0606  NA 141  < > 145  K 3.8  < > 4.3  CL 109  < > 109  CO2 27  < > 32  GLUCOSE 116*  < > 121*  BUN 18  < > 18  CREATININE 1.02  < > 0.70  CALCIUM 8.7*  < > 8.9  MG  --   --  2.5*  AST 73*  --   --   ALT 75*  --   --   ALKPHOS 38  --   --   BILITOT 0.1*  --   --   < > = values in this interval not displayed. ------------------------------------------------------------------------------------------------------------------  Cardiac Enzymes  Recent Labs Lab 07/18/15 0805  TROPONINI 1.29*   ------------------------------------------------------------------------------------------------------------------  RADIOLOGY:  No results found.   ASSESSMENT AND PLAN:   1. Acute respiratory failure with hypoxia secondary to bilateral submassive pulmonary embolism and concern that there is a component of neuromuscular resp failure.   Continue HF O2 with nebulizer treatment, follow-up critical care physician.  Pulmonary is managing with bipap at night. He has improved with that.  Aspiration but no evidence of pneumonia,  Zosyn and vancomycin IV were discontinued and negative blood culture. Aspiration precaution. Per Speech Study staff, no dysphagia.  * bilateral submassive pulmonary embolism * Left leg DVT.  * moderate  pulmonary hypertension  Changed to heparin drip and discontinued elquis, may change back to eliquis once out of ICU per Dr. Alva Garnet.  * Hypotension. possible due to sedation,  Improved after off sedation  2. Elevated troponin with right heart strain, changed to  heparin drip and discontinued Eliquis.  echo EF 60-65%, moderate pulmonary hypertension Telemetry monitor show possible ST elevation. I discussed with the Dr. Clayborn Bigness, he suggested getting EKG but he doesn't think acute respiratory failure is cardiac related. EKG showed NSR with 1st degree block.  3. History of diabetes mellitus Sliding scale insulin and lantus, add novolog 2 unit AC.  4. History of myotonic dystrophy Not on any home medications. PT.  5. Hyperkalemia  Improved after Kayexalate 1    All the records are reviewed and case discussed with Care Management/Social Workerr. Management plans discussed with the patient, his wife and parentes, and they are in agreement. >50% time spent on counselling and coordination of care.  CODE STATUS: Full code  TOTAL CRITICAL TIME TAKING CARE OF THIS PATIENT: 52 minutes.   POSSIBLE D/C IN  ? DAYS, DEPENDING ON CLINICAL CONDITION.   [name] M.D on 07/23/2015 at 8:43 AM  Between 7am to 6pm - Pager - [phone]  After 6pm go to [email or web address] - password [email or web address] [facility]  Office  [phone]  CC: Primary care physician; Dion Body, MD

## 2015-07-23 NOTE — Progress Notes (Addendum)
Speech Language Pathology Treatment: Dysphagia  Patient Details Name: Bradley Bradley MRN: [id number] DOB: 08/30/1966 Today's Date: 07/23/2015 Time: 6962-9528 SLP Time Calculation (min) (ACUTE ONLY): 12 min  Assessment / Plan / Recommendation Clinical Impression  Pt demonstrated no overt s/s of aspiration w/thin liquids. However, pt demonstrated a delayed cough when he attempted to converse with ST following sips. Discontinued any further PO trials d/t pt with difficulty stopping coughing at that point; informed nsg. Pt appeared to cough anytime he attempted to speak. Recommend continue w/current diet recommendations, however if pt is coughing or fatigued would recommend avoiding Po intake. Will f/u in 1-2 days.    HPI Other Pertinent Information: Pt is a 49 y.o. 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 Patient was also feeling weak, dizzy and nauseous. Came into the ED CT and a gram of the chest has revealed bilateral submassive pulmonary embolism and elevated troponin. Pt is now on HFNC for O2 support. Pt is currently on a HFNC but has required other increased O2 support during admission sec. to decline in medical/respiratory status'. Per MD notes pt was placed on BIPAP last night and is improving today. Nsg reports that pt ate breakfast (a few bites and sips) without difficulty. Per pt's wife pt was instructed to tuck his chin with swallow by MD to aid in swallow function.   Pertinent Vitals Pain Assessment: No/denies pain  SLP Plan  Continue with current plan of care    Recommendations Diet recommendations: Dysphagia 3 (mechanical soft);Thin liquid Liquids provided via: Cup;Straw Medication Administration: Whole meds with puree Supervision: Staff to assist with self feeding;Trained caregiver to feed  patient Compensations: Slow rate;Small sips/bites;Other (Comment) (Encourage rest breaks during meals. ) Postural Changes and/or Swallow Maneuvers: Upright 30-60 min after meal;Seated upright 90 degrees              Oral Care Recommendations: Staff/trained caregiver to provide oral care;Oral care BID Follow up Recommendations: Other (comment) (TBD) Plan: Continue with current plan of care    GO     [place] 07/23/2015, 11:35 AM  Addendum to treatment note: Returned to observe pt with lunch meal since only a couple sips of thin were initially given. Pt appeared to tolerate Dysphagia III diet w/thin liquids w/out overt s/s of aspiration. O2 and RR remained stable during meal. Coughing spells appear to still be caused by pt speaking and not PO intake. Encouraged pt to limit talk and distraction during meal.

## 2015-07-24 LAB — GLUCOSE, CAPILLARY
GLUCOSE-CAPILLARY: 172 mg/dL — AB (ref 65–99)
GLUCOSE-CAPILLARY: 214 mg/dL — AB (ref 65–99)
Glucose-Capillary: 112 mg/dL — ABNORMAL HIGH (ref 65–99)
Glucose-Capillary: 112 mg/dL — ABNORMAL HIGH (ref 65–99)
Glucose-Capillary: 266 mg/dL — ABNORMAL HIGH (ref 65–99)

## 2015-07-24 LAB — CBC
HEMATOCRIT: 29 % — AB (ref 40.0–52.0)
Hemoglobin: 9.6 g/dL — ABNORMAL LOW (ref 13.0–18.0)
MCH: 28.6 pg (ref 26.0–34.0)
MCHC: 33.1 g/dL (ref 32.0–36.0)
MCV: 86.3 fL (ref 80.0–100.0)
PLATELETS: 406 10*3/uL (ref 150–440)
RBC: 3.35 MIL/uL — ABNORMAL LOW (ref 4.40–5.90)
RDW: 15.2 % — ABNORMAL HIGH (ref 11.5–14.5)
WBC: 6.3 10*3/uL (ref 3.8–10.6)

## 2015-07-24 LAB — HEPARIN LEVEL (UNFRACTIONATED): HEPARIN UNFRACTIONATED: 0.61 [IU]/mL (ref 0.30–0.70)

## 2015-07-24 LAB — APTT: APTT: 89 s — AB (ref 24–36)

## 2015-07-24 NOTE — Progress Notes (Signed)
ANTICOAGULATION CONSULT NOTE - Follow Up Consult  Pharmacy Consult for Heparin Indication: pulmonary embolus  No Known Allergies  Patient Measurements: Height: 5\' 5"  (165.1 cm) (upon transfer) Weight: 180 lb 14.4 oz (82.056 kg) (upon transfer) IBW/kg (Calculated) : 61.5 Heparin Dosing Weight: 78.4 kg  Vital Signs: Temp: 96.4 F (35.8 C) (08/07 0300) Temp Source: Axillary (08/07 0300) BP: 97/62 mmHg (08/07 0300) Pulse Rate: 67 (08/07 0300)  Labs:  Recent Labs  07/21/15 0742  07/22/15 0327  07/23/15 0606 07/23/15 1301 07/24/15 0331  HGB 10.1*  --  10.8*  --  10.1*  --  9.6*  HCT 31.3*  --  33.8*  --  30.7*  --  29.0*  PLT 346  --  364  --  403  --  406  APTT  --   < > 63*  < > 114* 74* 89*  HEPARINUNFRC  --   < > 1.79*  --  1.06* 0.81* 0.61  CREATININE 1.00  --  0.87  --  0.70  --   --   < > = values in this interval not displayed.  Estimated Creatinine Clearance: 111.3 mL/min (by C-G formula based on Cr of 0.7).   Medications:  Scheduled:  . antiseptic oral rinse  7 mL Mouth Rinse q12n4p  . aspirin  162.5 mg Oral QHS  . budesonide (PULMICORT) nebulizer solution  0.5 mg Nebulization BID  . chlorhexidine gluconate  15 mL Mouth Rinse BID  . docusate sodium  100 mg Oral BID  . feeding supplement (ENSURE ENLIVE)  237 mL Oral BID BM  . fenofibrate  160 mg Oral Daily  . insulin aspart  0-15 Units Subcutaneous 6 times per day  . insulin aspart  2 Units Subcutaneous TID WC  . insulin glargine  15 Units Subcutaneous QHS  . ipratropium-albuterol  3 mL Nebulization Q4H  . pravastatin  40 mg Oral QHS   Infusions:  . dexmedetomidine 0.302 mcg/kg/hr (07/23/15 2000)  . heparin 1,500 Units/hr (07/23/15 2000)   PRN: acetaminophen **OR** acetaminophen, azelastine, guaiFENesin, guaifenesin, labetalol, ondansetron **OR** ondansetron (ZOFRAN) IV, oxyCODONE, sodium chloride  Assessment: 49 y/o M with BL PE patient previously converted to Eliquis, but is unable to tolerate oral  medications at this time. Patient received last dose in evening on 8/3.  APTT therapeutic   Goal of Therapy:  APTT: 68-109 Heparin level 0.3-0.7 units/ml Monitor platelets by anticoagulation protocol: Yes   07/24/15 Heparin level 0.61, aPTT 89, H&H 9.6/29  Plan:  Will continue current rate of 1500 units/hr, recheck aPTT, HL, and CBC with AM labs.  Pharmacy to follow per consult  Ovid Curd A. Jordan Hawks, PharmD Clinical Pharmacist  07/24/2015,4:17 AM

## 2015-07-24 NOTE — Evaluation (Signed)
Physical Therapy Evaluation Patient Details Name: Bradley Bradley MRN: [id number] DOB: [birth year] Today's Date: 07/24/2015   History of Present Illness  b/l PEs and L LE DVT, has Myotonic dystrophy  Clinical Impression  Pt shows great effort t/o session, though he does regularly need cuing to focus on breathing and does have his stats drop to low 80s at times even on 6 liters.  He is very eager to work with PT and is hoping that he does well enough to be able to go home.      Follow Up Recommendations SNF (per continued progress, pt wants to go home)    Equipment Recommendations   (pt has rollators at home)    Recommendations for Other Services       Precautions / Restrictions Precautions Precautions: Fall Restrictions Weight Bearing Restrictions: No      Mobility  Bed Mobility Overal bed mobility: Needs Assistance Bed Mobility: Supine to Sit;Sit to Supine     Supine to sit: Min assist;Mod assist Sit to supine: Min assist   General bed mobility comments: Pt is slow with getting in/out of bed, but is able to do it with light hand held assist and cuing  Transfers Overall transfer level: Needs assistance Equipment used: Rolling walker (2 wheeled) Transfers: Sit to/from Stand Sit to Stand: Min assist         General transfer comment: Much effort to get to standing, but pt is able to do so safety with light assist, his O2 does drop to mid 80s on 6 liters on initial standing  Ambulation/Gait Ambulation/Gait assistance: Min assist Ambulation Distance (Feet): 10 Feet Assistive device: Rolling walker (2 wheeled)     Gait velocity interpretation: <1.8 ft/sec, indicative of risk for recurrent falls General Gait Details: Pt definitely needs UE support on walker, but is not heavily reliant and though he does have some expected fatigue with focused breathing his is able to keep his O2 sats in the upper 80s most of the time  Stairs            Wheelchair  Mobility    Modified Rankin (Stroke Patients Only)       Balance                                             Pertinent Vitals/Pain Pain Assessment: No/denies pain    Home Living Family/patient expects to be discharged to:: Skilled nursing facility (per progress, pt wants to go home) Living Arrangements: Spouse/significant other;Other relatives                    Prior Function Level of Independence: Independent with assistive device(s) (cane)         Comments: Pt has done PT in the recent past and does try to stay active, does not drive much anymore     Hand Dominance        Extremity/Trunk Assessment   Upper Extremity Assessment: Generalized weakness (poor grip strength)           Lower Extremity Assessment: Generalized weakness         Communication   Communication: No difficulties  Cognition Arousal/Alertness: Awake/alert Behavior During Therapy: WFL for tasks assessed/performed Overall Cognitive Status: Within Functional Limits for tasks assessed  General Comments      Exercises        Assessment/Plan    PT Assessment Patient needs continued PT services  PT Diagnosis Difficulty walking;Generalized weakness   PT Problem List Decreased strength;Decreased range of motion;Decreased activity tolerance;Decreased balance;Decreased mobility  PT Treatment Interventions Gait training;Therapeutic activities;Therapeutic exercise;Balance training;Neuromuscular re-education   PT Goals (Current goals can be found in the Care Plan section) Acute Rehab PT Goals Patient Stated Goal: "I really do want to go home" PT Goal Formulation: With patient/family Time For Goal Achievement: 08/07/15 Potential to Achieve Goals: Good    Frequency Min 2X/week   Barriers to discharge        Co-evaluation               End of Session Equipment Utilized During Treatment: Gait belt;Oxygen (6  liters) Activity Tolerance: Patient limited by fatigue Patient left: in bed;with family/visitor present           Time: 1153-1220 PT Time Calculation (min) (ACUTE ONLY): 27 min   Charges:   PT Evaluation $Initial PT Evaluation Tier I: 1 Procedure     PT G Codes:       Wayne Both, PT, DPT [phone]  Kreg Shropshire 07/24/2015, 2:34 PM

## 2015-07-24 NOTE — Progress Notes (Signed)
Patient alert and oriented. NSR with 1st degree on monitor. Precedex off since 7am-No complaints of pain- tolerating diet much better today- foley in place and urine output adequate.  Wife at bedside and patient resting comfortably.

## 2015-07-24 NOTE — Progress Notes (Signed)
Poor attempt at the NIF

## 2015-07-24 NOTE — Consult Note (Signed)
PULMONARY/CCM CONSULT NOTE  Requesting MD/Service: Internal Medicine Date of admission: 7/31 Date of consult: 8/01 Reason for consultation: PE  CC: follow up SOB HPI:  Patient more alert and awake today, less WOB, wife at bedside I placed on precedex and biPAP last night again, which seemed to have helped resp status.   Patient still SOB, poor cough reflex, obtain NIF again today NIF's around -15 to -18    Past Medical History  Diagnosis Date  . Myotonic dystrophy   . 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 Marital Status: Married    Spouse Name: N/A  . Number of Children: N/A  . Years of Education: N/A   Occupational History  . Not on file.   Social History Main Topics  . Smoking status: Former Research scientist (life sciences)  . Smokeless tobacco: Not on file  . Alcohol Use: Yes     Comment: occasionally  . Drug Use: Not on file  . Sexual Activity: Not on file   Other Topics Concern  . Not on file   Social History Narrative    Family History  Problem Relation Age of Onset  . Pulmonary embolism Brother     ROS - Review of Systems  Constitutional: Positive for malaise/fatigue and diaphoresis. Negative for fever, chills and weight loss.  HENT: Negative for hearing loss and tinnitus.   Eyes: Negative for blurred vision, double vision and photophobia.  Respiratory: Positive for shortness of breath and wheezing. Negative for cough.   Gastrointestinal: Negative for heartburn, nausea and vomiting.  Genitourinary: Negative for dysuria and urgency.  Musculoskeletal: Negative for myalgias and neck pain.  Skin: Negative for itching and rash.  Neurological: Positive for weakness. Negative for dizziness, tingling and headaches.  Endo/Heme/Allergies: Does not bruise/bleed easily.  Psychiatric/Behavioral: Negative for depression, suicidal ideas and substance abuse.     Filed Vitals:   07/24/15 0400 07/24/15 0500 07/24/15 0600  07/24/15 0700  BP: 94/67 100/67 90/63 89/60   Pulse: 64 61 59 58  Temp:    98.2 F (36.8 C)  TempSrc:    Axillary  Resp: 14 13 12 11   Height:      Weight:      SpO2: 100% 97% 99% 99%   Physical Examination:   VS: BP 89/60 mmHg  Pulse 58  Temp(Src) 98.2 F (36.8 C) (Axillary)  Resp 11  Ht 5\' 5"  (1.651 m)  Wt 180 lb 14.4 oz (82.056 kg)  BMI 30.10 kg/m2  SpO2 99%  General Appearance: resp distress Neuro:without focal findings, mental status, speech-soft spoken, lethargic,  sensation grossly normal  2-3/5 muscle weakness UE HEENT: PERRLA, EOM intact, no ptosis, no other lesions noticed;  Pulmonary: normal breath sounds., diaphragmatic excursion abnormal, No rales;      CardiovascularNormal S1,S2.  No m/r/g.   Abdomen: Benign, Soft, non-tender, No masses, hepatosplenomegaly, No lymphadenopathy Renal:  No costovertebral tenderness  GU:  No performed at this time. Endoc: No evident thyromegaly, no signs of acromegaly or Cushing features Skin:   warm, no rashes, no ecchymosis  Extremities: normal, no cyanosis, clubbing, no edema, warm with normal capillary refill.   DATA:  BMET    Component Value Date/Time   NA 145 07/23/2015 0606   NA 141 01/21/2012 0623   K 4.3 07/23/2015 0606   K 4.6 01/21/2012 0623   CL 109 07/23/2015 0606   CL 106 01/21/2012 0623   CO2 32 07/23/2015 0606  CO2 27 01/21/2012 0623   GLUCOSE 121* 07/23/2015 0606   GLUCOSE 191* 01/21/2012 0623   BUN 18 07/23/2015 0606   BUN 23* 01/21/2012 0623   CREATININE 0.70 07/23/2015 0606   CREATININE 0.89 01/21/2012 0623   CALCIUM 8.9 07/23/2015 0606   CALCIUM 9.3 01/21/2012 0623   GFRNONAA >60 07/23/2015 0606   GFRAA >60 07/23/2015 0606    CBC    Component Value Date/Time   WBC 6.3 07/24/2015 0331   WBC 8.4 01/21/2012 0623   RBC 3.35* 07/24/2015 0331   RBC 4.45 01/21/2012 0623   HGB 9.6* 07/24/2015 0331   HGB 13.1 01/21/2012 0623   HCT 29.0* 07/24/2015 0331   HCT 40.1 01/21/2012 0623   PLT 406  07/24/2015 0331   PLT 226 01/21/2012 0623   MCV 86.3 07/24/2015 0331   MCV 90 01/21/2012 0623   MCH 28.6 07/24/2015 0331   MCH 29.5 01/21/2012 0623   MCHC 33.1 07/24/2015 0331   MCHC 32.7 01/21/2012 0623   RDW 15.2* 07/24/2015 0331   RDW 14.4 01/21/2012 0623   CXR: mild bibasilar atelectasis CT chest: large bilateral PE. Dilated RV EKG: No S1Q3T3 pattern. NSST changes LE venous Doppler scans: small residual DVT on L  IMPRESSION:    49 yo white male with acute B/l PE with underlying muscle dystrophy now with hypoxic and hypercapnic resp failure Patient at risk for intubation   1.Respiratory Failure-from PE and exacebation of Myotonic dystrophy - will transition to [place] from high flow [place] today -will continue precedex and BiPAP at night again tonight -continue Bronchodilator Therapy -check NIF as needed -speech therapy-dysphagia 3 diet with thin liquids -no need for abx/steroids at this time  2.PE-start heparin infusion 3.DM-follow fsbs Insulin as needed  Family updated and notified of poor situation  I have personally obtained a history, examined the patient, evaluated Pertinent laboratory and [employer]/imaging results, and  formulated the assessment and plan   The Patient requires high complexity decision making for assessment and support, frequent evaluation and titration of therapies, application of advanced monitoring technologies and extensive interpretation of multiple databases. Critical Care Time devoted to patient care services described in this note is 35 minutes.   Overall, patient is critically ill, prognosis is guarded.  Still at risk for intubation   Corrin Parker, M.D.  [facility]  Medical Director [place] Director [facility] Cardio-Pulmonary Department

## 2015-07-24 NOTE — Progress Notes (Signed)
Bradley Bradley at Bradley [facility] NAME: Bradley Bradley    MR#:  [id number]  DATE OF BIRTH:  12/11/1966  SUBJECTIVE:  CHIEF COMPLAINT:   Chief Complaint  Patient presents with  . Weakness   The patient is on HF O2,  off BiPAP. Was on bipap with precedex drip, feels much better this morning. Was given a trial of nasal canula oxygen- but he could not tolerate it and swiched back to HFNC>   REVIEW OF SYSTEMS:  CONSTITUTIONAL: No fever, but has weakness.  EYES: No blurred or double vision.  EARS, NOSE, AND THROAT: No tinnitus or ear pain.  RESPIRATORY: Has cough, has shortness of breath, no wheezing or hemoptysis.  CARDIOVASCULAR: No chest pain, orthopnea, edema.  GASTROINTESTINAL: No nausea, vomiting, diarrhea or abdominal pain. No melena or bloody stool. GENITOURINARY: No dysuria, hematuria.  ENDOCRINE: No polyuria, nocturia,  HEMATOLOGY: No anemia, easy bruising or bleeding SKIN: No rash or lesion. MUSCULOSKELETAL: No joint pain or arthritis.  NEUROLOGIC: No tingling, numbness, weakness.  PSYCHIATRY: No anxiety or depression.   DRUG ALLERGIES:  No Known Allergies  VITALS:  Blood pressure 133/77, pulse 101, temperature 98.2 F (36.8 C), temperature source Axillary, resp. rate 21, height 5\' 5"  (1.651 m), weight 82.056 kg (180 lb 14.4 oz), SpO2 91 %.  PHYSICAL EXAMINATION:  GENERAL:  49 y.o.-year-old patient lying in the bed,no active distress. EYES: Pupils equal, round, reactive to light. No scleral icterus. Extraocular muscles intact.  HEENT: Head atraumatic, normocephalic. NECK:  Supple, no jugular venous distention. No thyroid enlargement, no tenderness.  LUNGS: Normal breath sounds bilaterally, no wheezing. Mild crackles on left base with use of accessory muscles of respiration. When I saw- he was on nasal canula and in distress , started back on HFNC. CARDIOVASCULAR: S1, S2 normal. No murmurs, rubs, or gallops.   ABDOMEN: Soft, nontender, nondistended. Bowel sounds present. No organomegaly or mass.  EXTREMITIES: No pedal edema, cyanosis, or clubbing.  NEUROLOGIC: follows commands, moves all 4 limbs- power 5/5. PSYCHIATRIC:alert and oriented x 3.  SKIN: No obvious rash, lesion, or ulcer.    LABORATORY PANEL:   CBC  Recent Labs Lab 07/24/15 0331  WBC 6.3  HGB 9.6*  HCT 29.0*  PLT 406   ------------------------------------------------------------------------------------------------------------------  Chemistries   Recent Labs Lab 07/18/15 0326  07/23/15 0606  NA 141  < > 145  K 3.8  < > 4.3  CL 109  < > 109  CO2 27  < > 32  GLUCOSE 116*  < > 121*  BUN 18  < > 18  CREATININE 1.02  < > 0.70  CALCIUM 8.7*  < > 8.9  MG  --   --  2.5*  AST 73*  --   --   ALT 75*  --   --   ALKPHOS 38  --   --   BILITOT 0.1*  --   --   < > = values in this interval not displayed. ------------------------------------------------------------------------------------------------------------------  Cardiac Enzymes  Recent Labs Lab 07/18/15 0805  TROPONINI 1.29*   ------------------------------------------------------------------------------------------------------------------  RADIOLOGY:  No results found.   ASSESSMENT AND PLAN:   1. Acute respiratory failure with hypoxia secondary to bilateral submassive pulmonary embolism and concern that there is a component of neuromuscular resp failure.   Continue HF O2 with nebulizer treatment, follow-up critical care physician.  Pulmonary is managing with bipap at night. He has improved with that.  Could not tolerate Nasal canula, continue same treatment.  Aspiration but no evidence of pneumonia,  Zosyn and vancomycin IV were discontinued and negative blood culture. Aspiration precaution. Per Speech Study staff, no dysphagia.  * bilateral submassive pulmonary embolism * Left leg DVT.  * moderate pulmonary hypertension  Changed to heparin drip  and discontinued elquis, may change back to eliquis once out of ICU .  * Hypotension. possible due to sedation,  Improved after off sedation  2. Elevated troponin with right heart strain, changed to heparin drip and discontinued Eliquis.  echo EF 60-65%, moderate pulmonary hypertension Telemetry monitor show possible ST elevation. I discussed with the Dr. Clayborn Bigness, he suggested getting EKG but he doesn't think acute respiratory failure is cardiac related. EKG showed NSR with 1st degree block.  3. History of diabetes mellitus Sliding scale insulin and lantus, add novolog 2 unit AC.  4. History of myotonic dystrophy Not on any home medications. PT.  5. Hyperkalemia  Improved after Kayexalate 1  He still need few more days till he can tolerate nasal canula oxygen.   All the records are reviewed and case discussed with Care Management/Social Workerr. Management plans discussed with the patient, his wife and parentes, and they are in agreement. >50% time spent on counselling and coordination of care.  CODE STATUS: Full code  TOTAL CRITICAL TIME TAKING CARE OF THIS PATIENT: 35 minutes.   POSSIBLE D/C IN  ? DAYS, DEPENDING ON CLINICAL CONDITION.   [name] M.D on 07/24/2015 at 2:38 PM  Between 7am to 6pm - Pager - [phone]  After 6pm go to [email or web address] - password [email or web address] [facility]  Office  [phone]  CC: Primary care physician; Dion Body, MD

## 2015-07-25 ENCOUNTER — Ambulatory Visit (HOSPITAL_COMMUNITY)
Admission: AD | Admit: 2015-07-25 | Discharge: 2015-07-25 | Disposition: A | Discharge status: Home / Self Care | Payer: Medicare Other | Source: Other Acute Inpatient Hospital | Attending: Internal Medicine | Admitting: Internal Medicine

## 2015-07-25 ENCOUNTER — Inpatient Hospital Stay
Admission: AD | Admit: 2015-07-25 | Discharge: 2015-08-06 | Disposition: A | Discharge status: Home Health | Payer: Self-pay | Source: Ambulatory Visit | Attending: Internal Medicine | Admitting: Internal Medicine

## 2015-07-25 DIAGNOSIS — I2699 Other pulmonary embolism without acute cor pulmonale: Secondary | ICD-10-CM | POA: Insufficient documentation

## 2015-07-25 DIAGNOSIS — J9690 Respiratory failure, unspecified, unspecified whether with hypoxia or hypercapnia: Secondary | ICD-10-CM

## 2015-07-25 DIAGNOSIS — Z9189 Other specified personal risk factors, not elsewhere classified: Secondary | ICD-10-CM

## 2015-07-25 LAB — COMPREHENSIVE METABOLIC PANEL
ALT: 31 U/L (ref 17–63)
AST: 22 U/L (ref 15–41)
Albumin: 2.7 g/dL — ABNORMAL LOW (ref 3.5–5.0)
Alkaline Phosphatase: 60 U/L (ref 38–126)
Anion gap: 6 (ref 5–15)
BUN: 8 mg/dL (ref 6–20)
CALCIUM: 9.4 mg/dL (ref 8.9–10.3)
CO2: 31 mmol/L (ref 22–32)
CREATININE: 0.74 mg/dL (ref 0.61–1.24)
Chloride: 104 mmol/L (ref 101–111)
GFR calc non Af Amer: >60 mL/min (ref >60)
Glucose, Bld: 235 mg/dL — ABNORMAL HIGH (ref 65–99)
POTASSIUM: 5.5 mmol/L — AB (ref 3.5–5.1)
SODIUM: 141 mmol/L (ref 135–145)
Total Bilirubin: 0.4 mg/dL (ref 0.3–1.2)
Total Protein: 6 g/dL — ABNORMAL LOW (ref 6.5–8.1)

## 2015-07-25 LAB — CBC
HEMATOCRIT: 29.9 % — AB (ref 40.0–52.0)
Hemoglobin: 9.6 g/dL — ABNORMAL LOW (ref 13.0–18.0)
MCH: 27.7 pg (ref 26.0–34.0)
MCHC: 32.3 g/dL (ref 32.0–36.0)
MCV: 86 fL (ref 80.0–100.0)
Platelets: 360 10*3/uL (ref 150–440)
RBC: 3.48 MIL/uL — AB (ref 4.40–5.90)
RDW: 15.1 % — ABNORMAL HIGH (ref 11.5–14.5)
WBC: 4.2 10*3/uL (ref 3.8–10.6)

## 2015-07-25 LAB — APTT
aPTT: 28 seconds (ref 24–36)
aPTT: 30 seconds (ref 24–37)

## 2015-07-25 LAB — GLUCOSE, CAPILLARY
GLUCOSE-CAPILLARY: 112 mg/dL — AB (ref 65–99)
GLUCOSE-CAPILLARY: 180 mg/dL — AB (ref 65–99)
GLUCOSE-CAPILLARY: 205 mg/dL — AB (ref 65–99)
GLUCOSE-CAPILLARY: 226 mg/dL — AB (ref 65–99)
Glucose-Capillary: 118 mg/dL — ABNORMAL HIGH (ref 65–99)

## 2015-07-25 LAB — PROTIME-INR
INR: 1.34 (ref 0.00–1.49)
Prothrombin Time: 16.7 seconds — ABNORMAL HIGH (ref 11.6–15.2)

## 2015-07-25 LAB — CBC WITH DIFFERENTIAL/PLATELET
BASOS ABS: 0.1 10*3/uL (ref 0.0–0.1)
BASOS PCT: 1 % (ref 0–1)
EOS ABS: 0.1 10*3/uL (ref 0.0–0.7)
Eosinophils Relative: 2 % (ref 0–5)
HCT: 34.4 % — ABNORMAL LOW (ref 39.0–52.0)
Hemoglobin: 10.8 g/dL — ABNORMAL LOW (ref 13.0–17.0)
Lymphocytes Relative: 21 % (ref 12–46)
Lymphs Abs: 1.3 10*3/uL (ref 0.7–4.0)
MCH: 27.3 pg (ref 26.0–34.0)
MCHC: 31.4 g/dL (ref 30.0–36.0)
MCV: 86.9 fL (ref 78.0–100.0)
Monocytes Absolute: 0.7 10*3/uL (ref 0.1–1.0)
Monocytes Relative: 11 % (ref 3–12)
NEUTROS PCT: 65 % (ref 43–77)
Neutro Abs: 3.8 10*3/uL (ref 1.7–7.7)
PLATELETS: 420 10*3/uL — AB (ref 150–400)
RBC: 3.96 MIL/uL — ABNORMAL LOW (ref 4.22–5.81)
RDW: 15.1 % (ref 11.5–15.5)
WBC: 5.9 10*3/uL (ref 4.0–10.5)

## 2015-07-25 LAB — BLOOD GAS, ARTERIAL
ACID-BASE EXCESS: 3.5 mmol/L — AB (ref 0.0–2.0)
Bicarbonate: 27.9 mEq/L — ABNORMAL HIGH (ref 20.0–24.0)
O2 Content: 10 L/min
O2 Saturation: 92.4 %
PATIENT TEMPERATURE: 98.6
PH ART: 7.406 (ref 7.350–7.450)
PO2 ART: 67.7 mmHg — AB (ref 80.0–100.0)
TCO2: 29.2 mmol/L (ref 0–100)
pCO2 arterial: 45.3 mmHg — ABNORMAL HIGH (ref 35.0–45.0)

## 2015-07-25 LAB — HEPARIN LEVEL (UNFRACTIONATED): Heparin Unfractionated: 0.1 IU/mL — ABNORMAL LOW (ref 0.30–0.70)

## 2015-07-25 LAB — MAGNESIUM: Magnesium: 2.3 mg/dL (ref 1.7–2.4)

## 2015-07-25 LAB — PHOSPHORUS: Phosphorus: 2.2 mg/dL — ABNORMAL LOW (ref 2.5–4.6)

## 2015-07-25 MED ORDER — BUDESONIDE 0.5 MG/2ML IN SUSP: 0.5000 mg | Freq: Two times a day (BID) | RESPIRATORY_TRACT | Status: AC

## 2015-07-25 MED ORDER — APIXABAN 5 MG PO TABS: 5.0000 mg | ORAL_TABLET | Freq: Two times a day (BID) | ORAL | Status: DC

## 2015-07-25 MED ORDER — INSULIN ASPART 100 UNIT/ML ~~LOC~~ SOLN
3.0000 [IU] | Freq: Three times a day (TID) | SUBCUTANEOUS | Status: DC
  Administered 2015-07-25: 3 [IU] via SUBCUTANEOUS
  Filled 2015-07-25: qty 3

## 2015-07-25 MED ORDER — IPRATROPIUM-ALBUTEROL 0.5-2.5 (3) MG/3ML IN SOLN: 3.0000 mL | RESPIRATORY_TRACT | Status: AC

## 2015-07-25 MED ORDER — ENSURE ENLIVE PO LIQD: 237.0000 mL | Freq: Two times a day (BID) | ORAL | Status: AC

## 2015-07-25 MED ORDER — INSULIN ASPART 100 UNIT/ML ~~LOC~~ SOLN
0.0000 [IU] | Freq: Three times a day (TID) | SUBCUTANEOUS | Status: DC
  Administered 2015-07-25: 2 [IU] via SUBCUTANEOUS
  Filled 2015-07-25: qty 2

## 2015-07-25 MED ORDER — GUAIFENESIN-DM 100-10 MG/5ML PO SYRP
5.0000 mL | ORAL_SOLUTION | ORAL | Status: DC | PRN
  Administered 2015-07-25 (×2): 5 mL via ORAL
  Filled 2015-07-25 (×2): qty 5

## 2015-07-25 MED ORDER — INSULIN GLARGINE 100 UNIT/ML ~~LOC~~ SOLN: 15.0000 [IU] | Freq: Every day | SUBCUTANEOUS | Status: AC

## 2015-07-25 MED ORDER — INSULIN ASPART 100 UNIT/ML ~~LOC~~ SOLN: 3.0000 [IU] | Freq: Three times a day (TID) | SUBCUTANEOUS | Status: AC

## 2015-07-25 MED ORDER — APIXABAN 5 MG PO TABS: 10.0000 mg | ORAL_TABLET | Freq: Two times a day (BID) | ORAL | Status: AC

## 2015-07-25 MED ORDER — APIXABAN 5 MG PO TABS: 5.0000 mg | ORAL_TABLET | Freq: Two times a day (BID) | ORAL | Status: AC

## 2015-07-25 MED ORDER — HEPARIN BOLUS VIA INFUSION
2400.0000 [IU] | Freq: Once | INTRAVENOUS | Status: AC
  Administered 2015-07-25: 2400 [IU] via INTRAVENOUS
  Filled 2015-07-25: qty 2400

## 2015-07-25 MED ORDER — INSULIN GLARGINE 100 UNIT/ML ~~LOC~~ SOLN
10.0000 [IU] | Freq: Every day | SUBCUTANEOUS | Status: DC
  Filled 2015-07-25: qty 0.1

## 2015-07-25 MED ORDER — OXYCODONE HCL 5 MG PO TABS: 5.0000 mg | ORAL_TABLET | ORAL | Status: AC | PRN

## 2015-07-25 MED ORDER — APIXABAN 5 MG PO TABS
10.0000 mg | ORAL_TABLET | Freq: Two times a day (BID) | ORAL | Status: DC
  Administered 2015-07-25: 10 mg via ORAL
  Filled 2015-07-25: qty 2

## 2015-07-25 MED ORDER — INSULIN ASPART 100 UNIT/ML ~~LOC~~ SOLN: 0.0000 [IU] | Freq: Every day | SUBCUTANEOUS | Status: DC

## 2015-07-25 NOTE — Progress Notes (Signed)
C/O hacking cough No distress  Filed Vitals:   07/25/15 0900 07/25/15 1000 07/25/15 1100 07/25/15 1150  BP: 124/72 129/74 130/73 124/72  Pulse: 80 76 97 78  Temp:      TempSrc:      Resp: 17 16 21    Height:      Weight:      SpO2: 94% 94% 92% 95%   NAD No JVD Clear anteriorly Reg, no M NABS, soft No LE edema  BMET    Component Value Date/Time   NA 145 07/23/2015 0606   NA 141 01/21/2012 0623   K 4.3 07/23/2015 0606   K 4.6 01/21/2012 0623   CL 109 07/23/2015 0606   CL 106 01/21/2012 0623   CO2 32 07/23/2015 0606   CO2 27 01/21/2012 0623   GLUCOSE 121* 07/23/2015 0606   GLUCOSE 191* 01/21/2012 0623   BUN 18 07/23/2015 0606   BUN 23* 01/21/2012 0623   CREATININE 0.70 07/23/2015 0606   CREATININE 0.89 01/21/2012 0623   CALCIUM 8.9 07/23/2015 0606   CALCIUM 9.3 01/21/2012 0623   GFRNONAA >60 07/23/2015 0606   GFRAA >60 07/23/2015 0606    CBC    Component Value Date/Time   WBC 4.2 07/25/2015 0616   WBC 8.4 01/21/2012 0623   RBC 3.48* 07/25/2015 0616   RBC 4.45 01/21/2012 0623   HGB 9.6* 07/25/2015 0616   HGB 13.1 01/21/2012 0623   HCT 29.9* 07/25/2015 0616   HCT 40.1 01/21/2012 0623   PLT 360 07/25/2015 0616   PLT 226 01/21/2012 0623   MCV 86.0 07/25/2015 0616   MCV 90 01/21/2012 0623   MCH 27.7 07/25/2015 0616   MCH 29.5 01/21/2012 0623   MCHC 32.3 07/25/2015 0616   MCHC 32.7 01/21/2012 0623   RDW 15.1* 07/25/2015 0616   RDW 14.4 01/21/2012 0623    No new CXR  IMPRESSION: Very large PE 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 Reportedly has been accepted by [facility] in [place] will sign off. Please call if we can be of further assistance  Merton Border, MD ; [facility] [phone].  After  5:30 PM or weekends, call [phone]

## 2015-07-25 NOTE — Care Management (Addendum)
Spoke again with patient, his parents, in presence of Dr. Anselm Jungling. All agree with transfer to [facility] today. Coryn with [facility] notified. Patient will need to transport by [facility]. Wife would like to follow [facility]. I told her it should take around 1-2 hours to arrange.

## 2015-07-25 NOTE — Progress Notes (Signed)
ANTICOAGULATION CONSULT NOTE - Follow Up Consult  Pharmacy Consult for Heparin Indication: pulmonary embolus  No Known Allergies  Patient Measurements: Height: 5\' 5"  (165.1 cm) (upon transfer) Weight: 180 lb 14.4 oz (82.056 kg) (upon transfer) IBW/kg (Calculated) : 61.5 Heparin Dosing Weight: 78.4 kg  Vital Signs: Temp: 98 F (36.7 C) (08/08 0130) Temp Source: Axillary (08/08 0130) BP: 93/66 mmHg (08/08 0600) Pulse Rate: 64 (08/08 0600)  Labs:  Recent Labs  07/23/15 0606 07/23/15 1301 07/24/15 0331 07/25/15 0616  HGB 10.1*  --  9.6* 9.6*  HCT 30.7*  --  29.0* 29.9*  PLT 403  --  406 360  APTT 114* 74* 89* 28  HEPARINUNFRC 1.06* 0.81* 0.61  --   CREATININE 0.70  --   --   --     Estimated Creatinine Clearance: 111.3 mL/min (by C-G formula based on Cr of 0.7).   Medications:  Scheduled:  . antiseptic oral rinse  7 mL Mouth Rinse q12n4p  . aspirin  162.5 mg Oral QHS  . budesonide (PULMICORT) nebulizer solution  0.5 mg Nebulization BID  . chlorhexidine gluconate  15 mL Mouth Rinse BID  . docusate sodium  100 mg Oral BID  . feeding supplement (ENSURE ENLIVE)  237 mL Oral BID BM  . fenofibrate  160 mg Oral Daily  . heparin  2,400 Units Intravenous Once  . insulin aspart  0-15 Units Subcutaneous 6 times per day  . insulin aspart  2 Units Subcutaneous TID WC  . insulin glargine  15 Units Subcutaneous QHS  . ipratropium-albuterol  3 mL Nebulization Q4H  . pravastatin  40 mg Oral QHS   Infusions:  . dexmedetomidine Stopped (07/25/15 0645)  . heparin 1,500 Units/hr (07/24/15 1119)   PRN: acetaminophen **OR** acetaminophen, azelastine, guaifenesin, labetalol, ondansetron **OR** ondansetron (ZOFRAN) IV, oxyCODONE, sodium chloride  Assessment: 49 y/o M with BL PE patient previously converted to Eliquis, but is unable to tolerate oral medications at this time. Patient received last dose in evening on 8/3.  APTT therapeutic   Goal of Therapy:  APTT: 68-109 Heparin  level 0.3-0.7 units/ml Monitor platelets by anticoagulation protocol: Yes   07/25/15 Heparin level less than 0.1, aPTT 28  Plan:  Anticoagulation subtherapeutic. Ordered 30 unit/kg bolus (2400 units x 1) and increased rate to 1900 units/hr. Will check heparin level in 6 hours.  Pharmacy to follow per consult  Ovid Curd A. Jordan Hawks, PharmD Clinical Pharmacist  07/25/2015,7:21 AM

## 2015-07-25 NOTE — Progress Notes (Signed)
Physical Therapy Treatment Patient Details Name: Bradley Bradley MRN: [id number] DOB: [birth year] Today's Date: 07/25/2015    History of Present Illness b/l PEs and L LE DVT, has Myotonic dystrophy    PT Comments    Pt reports feeling tired, but was ready to complete therapy session today. Pt was able to transfer to seated position at the EOB and required min assist/HHA, O2 sats dropped to mid 80s but normalized after ~5 min. Pt was able to stand up with RW/min assist, with verbal cuing to push off of the bed, initially could only stand for 30 sec secondary to O2 sats dropping to low 80s and then pt had to return to sitting at EOB.  On 3rd attempt to standing, pt O2 sats remained normal and pt was able to ambulate 53ft and complete standing balance therex (partial squats and forward stepping) with RW/min assist.  Pt would benefit from skilled PT in order to increase cardiorespiratory endurance/gross strength and improve with transfers.      Follow Up Recommendations  SNF     Equipment Recommendations  Rolling walker with 5" wheels    Recommendations for Other Services       Precautions / Restrictions Precautions Precautions: Fall Restrictions Weight Bearing Restrictions: No    Mobility  Bed Mobility Overal bed mobility: Needs Assistance Bed Mobility: Supine to Sit     Supine to sit: Min assist     General bed mobility comments: Pt is able to transition to sitting with HHA, pt transfers to sitting in slow increments.   Transfers Overall transfer level: Needs assistance Equipment used: Rolling walker (2 wheeled) Transfers: Sit to/from Stand Sit to Stand: Min assist         General transfer comment: Pt required verbal cuing to push off of the bed and he was able to ascend with no difficulty, previous attempts were unsuccessful secondary to putting both hands on RW.  Pt performed wit to stand transfer 3x durring session.   Ambulation/Gait Ambulation/Gait  assistance: Min assist Ambulation Distance (Feet): 20 Feet (10 ft forwards and 45ft backwards.) Assistive device: Rolling walker (2 wheeled)     Gait velocity interpretation: <1.8 ft/sec, indicative of risk for recurrent falls General Gait Details: Pt was able to ambulate with O2s dropping to 85 at the lowest.  Pt presents with thoracic kyphosis and step to pattern with ambulation. Pt ambulatio is very slow, tactile facilitation of the RW was required to increase speed, pt initially presented with fear after increase with speed.    Stairs            Wheelchair Mobility    Modified Rankin (Stroke Patients Only)       Balance Overall balance assessment: Needs assistance Sitting-balance support: Bilateral upper extremity supported;Single extremity supported;Feet supported Sitting balance-Leahy Scale: Fair Sitting balance - Comments: Initially, pt required both hands to be assisted by either HHA or bed rail.  After 5 min of talking with pt/waiting for O2s to normalize, pt appeared to be more comfortabl in sitting and only required 1 HHA (min assist) with feet supported.    Standing balance support: Bilateral upper extremity supported Standing balance-Leahy Scale: Fair Standing balance comment: At the beginning of session, pt was able to stand up for only 30 sec and then had to return to sitting at EOB secondary to dropping O2 sats.  Pt was able to ambulate and complete partial squats/forward stepping with heavy UE compensation at the end of the session.  Cognition Arousal/Alertness: Awake/alert Behavior During Therapy: WFL for tasks assessed/performed Overall Cognitive Status: Within Functional Limits for tasks assessed                      Exercises Other Exercises Other Exercises: Seated at EOB min assist bilat hip marches, 1 x 15.  Other Exercises: Standing with RW (min assist) partial squats/forward stepping, 1 x 10.     General Comments         Pertinent Vitals/Pain Pain Assessment: No/denies pain    Home Living                      Prior Function            PT Goals (current goals can now be found in the care plan section) Acute Rehab PT Goals Patient Stated Goal: "I really do want to go home" PT Goal Formulation: With patient/family Time For Goal Achievement: 08/07/15 Potential to Achieve Goals: Good Progress towards PT goals: Progressing toward goals    Frequency  Min 2X/week    PT Plan Current plan remains appropriate    Co-evaluation             End of Session Equipment Utilized During Treatment: Gait belt;Oxygen Activity Tolerance: Patient limited by fatigue Patient left: with family/visitor present;in chair;with bed alarm set;with call bell/phone within reach     Time: 0600-4599 PT Time Calculation (min) (ACUTE ONLY): 33 min  Charges:                       G Codes:      Bernestine Amass, SPT 08/11/2015 12:05 PM

## 2015-07-25 NOTE — Clinical Social Work Note (Signed)
PT had recommended SNF for patient previously. CSW will not initiate as patient is scheduled to go to [facility] today as arranged by RN CM. Shela Leff MSW,LCSW [phone]

## 2015-07-25 NOTE — Care Management (Signed)
Met again with patient and his wife. Patient still wants to return home after this hospitalization. He is currently on 6 liters O2/[place]. They are not decided on LTAC. Wife is concerned about distance/travel. RNCM will follow up with wife after lunch today per her request.

## 2015-07-25 NOTE — Care Management Important Message (Signed)
Important Message  Patient Details  Name: Bradley Bradley MRN: [id number] Date of Birth: [birth year]   Medicare Important Message Given:  Yes-fourth notification given    Juliann Pulse A Allmond 07/25/2015, 10:26 AM

## 2015-07-25 NOTE — Progress Notes (Signed)
Report given to Legrand Como from [facility].

## 2015-07-25 NOTE — Progress Notes (Signed)
Chart reviewed. Pt is tolerating current dys 3 diet without difficulty.Probable discharge today. F/u 1-3 days if Pt is still here. Continue with current diet at discharge

## 2015-07-25 NOTE — Discharge Summary (Signed)
[facility] [place] NAME: Bradley Bradley    MR#:  [id number]  DATE OF BIRTH:  [birth year]  DATE OF ADMISSION:  07/17/2015 ADMITTING PHYSICIAN: Nicholes Mango, MD  DATE OF DISCHARGE: 07/25/2015  PRIMARY CARE PHYSICIAN: Dion Body, MD    ADMISSION DIAGNOSIS:  Pulmonary embolus [I26.99]  DISCHARGE DIAGNOSIS:  Active Problems:   Bilateral pulmonary embolism   Myotonic dystrophy, type 1   Acute respiratory failure   SECONDARY DIAGNOSIS:   Past Medical History  Diagnosis Date  . Myotonic dystrophy   . Diabetes mellitus without complication     TYPE ONE    HOSPITAL COURSE:   * Acute respiratory failure with hypoxia secondary to bilateral submassive pulmonary embolism and concern that there is a component of neuromuscular resp failure.  Continued HF O2 with nebulizer treatment, follow-up by critical care physician. Pulmonary team is managing with bipap at night. He has improved with that. finally on 07/25/15- he was able to tolerate nasal canula oxygen 6 ltr/min.   As it is anticipated - he may need still few days to week- transferred to [facility].    Suspected Aspiration but no evidence of pneumonia, Zosyn and vancomycin IV were discontinued and       negative blood culture. Aspiration precaution. Per Speech Study staff, no dysphagia.  * bilateral submassive pulmonary embolism * Left leg DVT.  * moderate pulmonary hypertension given heparin drip and discontinued elquis due to worsening in ICU .  later after improvement- changed back to eliquis.  * Hypotension. possible due to sedation, Improved after off sedation  * Elevated troponin with right heart strain,  changed to heparin drip and discontinued Eliquis. echo EF 60-65%, moderate pulmonary hypertension  Telemetry monitor show possible ST elevation. I discussed with the Dr. Clayborn Bigness, he suggested getting EKG  but he doesn't think acute respiratory failure  is cardiac related. EKG showed NSR with 1st degree block.  * History of diabetes mellitus Sliding scale insulin and lantus, add novolog 3 unit AC.  * History of myotonic dystrophy Not on any home medications. PT.  * Hyperkalemia  Improved after Kayexalate 1   DISCHARGE CONDITIONS:   stable  CONSULTS OBTAINED:  Treatment Team:  Lloyd Huger, MD  Dr. Mortimer Fries and Dr.Simonds  DRUG ALLERGIES:  No Known Allergies  DISCHARGE MEDICATIONS:   Current Discharge Medication List    START taking these medications   Details  !! apixaban (ELIQUIS) 5 MG TABS tablet Take 2 tablets (10 mg total) by mouth 2 (two) times daily. Qty: 12 tablet, Refills: 0    !! apixaban (ELIQUIS) 5 MG TABS tablet Take 1 tablet (5 mg total) by mouth 2 (two) times daily. Qty: 60 tablet, Refills: 0    budesonide (PULMICORT) 0.5 MG/2ML nebulizer solution Take 2 mLs (0.5 mg total) by nebulization 2 (two) times daily. Qty: 20 mL, Refills: 12    feeding supplement, ENSURE ENLIVE, (ENSURE ENLIVE) LIQD Take 237 mLs by mouth 2 (two) times daily between meals. Qty: 237 mL, Refills: 12    insulin glargine (LANTUS) 100 UNIT/ML injection Inject 0.15 mLs (15 Units total) into the skin at bedtime. Qty: 10 mL, Refills: 11    ipratropium-albuterol (DUONEB) 0.5-2.5 (3) MG/3ML SOLN Take 3 mLs by nebulization every 4 (four) hours. Qty: 360 mL, Refills: 0    oxyCODONE (OXY IR/ROXICODONE) 5 MG immediate release tablet Take 1 tablet (5 mg total) by mouth every 4 (four) hours as needed for moderate pain. Qty:  30 tablet, Refills: 0     !! - Potential duplicate medications found. Please discuss with provider.    CONTINUE these medications which have CHANGED   Details  insulin aspart (NOVOLOG) 100 UNIT/ML injection Inject 3 Units into the skin 3 (three) times daily with meals. Qty: 10 mL, Refills: 11      CONTINUE these medications which have NOT CHANGED   Details  azelastine (ASTELIN) 0.1 % nasal spray Place 2 sprays  into both nostrils 2 (two) times daily as needed for rhinitis. Use in each nostril as directed    b complex vitamins tablet Take 1 tablet by mouth at bedtime.    Cinnamon 500 MG capsule Take 500 mg by mouth every other day.     Coenzyme Q10 (CO Q 10 PO) Take 1 tablet by mouth at bedtime.    fenofibrate (TRICOR) 145 MG tablet Take 145 mg by mouth at bedtime.    Ginger, Zingiber officinalis, 500 MG CAPS Take 1 capsule by mouth every other day.    glucosamine-chondroitin 500-400 MG tablet Take 0.5 tablets by mouth at bedtime.    lansoprazole (PREVACID) 15 MG capsule Take 15 mg by mouth daily at 12 noon.    Multiple Vitamin (MULTIVITAMIN) tablet Take 1 tablet by mouth at bedtime.    pravastatin (PRAVACHOL) 40 MG tablet Take 40 mg by mouth at bedtime.      STOP taking these medications     amoxicillin-clavulanate (AUGMENTIN) 875-125 MG per tablet      aspirin 325 MG tablet      Insulin Glulisine (APIDRA SOLOSTAR) 100 UNIT/ML Solostar Pen      lisinopril-hydrochlorothiazide (PRINZIDE,ZESTORETIC) 10-12.5 MG per tablet      metFORMIN (GLUCOPHAGE) 500 MG tablet          DISCHARGE INSTRUCTIONS:    Need Bipap at night.  If you experience worsening of your admission symptoms, develop shortness of breath, life threatening emergency, suicidal or homicidal thoughts you must seek medical attention immediately by calling 911 or calling your MD immediately  if symptoms less severe.  You Must read complete instructions/literature along with all the possible adverse reactions/side effects for all the Medicines you take and that have been prescribed to you. Take any new Medicines after you have completely understood and accept all the possible adverse reactions/side effects.   Please note  You were cared for by a hospitalist during your hospital stay. If you have any questions about your discharge medications or the care you received while you were in the hospital after you are discharged,  you can call the unit and asked to speak with the hospitalist on call if the hospitalist that took care of you is not available. Once you are discharged, your primary care physician will handle any further medical issues. Please note that NO REFILLS for any discharge medications will be authorized once you are discharged, as it is imperative that you return to your primary care physician (or establish a relationship with a primary care physician if you do not have one) for your aftercare needs so that they can reassess your need for medications and monitor your lab values.  Today   CHIEF COMPLAINT:   Chief Complaint  Patient presents with  . Weakness    HISTORY OF PRESENT ILLNESS:  Bradley Bradley  is a 49 y.o. male with a known history of diabetic mellitus and myotonic dystrophy is presenting to the ED with a chief complaint of shortness of breath for the past one week  which has been worse today associated with chest tightness. Patient was also feeling weak, dizzy and nauseous. Came into the ED CT and a gram of the chest has revealed bilateral submassive pulmonary embolism and elevated troponin at 0.11. Patient was heparinized with heparin bolus and heparin drip was initiated in the ED. ED physician has discussed with on-call vascular surgeon Dr. Marzetta Board who has recommended medical management with heparin but no thrombectomy at this time. Patient denies any injuries, long periods of immobility or history of cancer. Reporting slight discomfort in his chest but denies any chest pain   VITAL SIGNS:  Blood pressure 131/78, pulse 95, temperature 98 F (36.7 C), temperature source Oral, resp. rate 16, height 5\' 5"  (1.651 m), weight 82.056 kg (180 lb 14.4 oz), SpO2 92 %.  I/O:   Intake/Output Summary (Last 24 hours) at 07/25/15 1512 Last data filed at 07/25/15 1300  Gross per 24 hour  Intake 504.25 ml  Output    200 ml  Net 304.25 ml    PHYSICAL EXAMINATION:  GENERAL: 48 y.o.-year-old  patient lying in the bed,no active distress. EYES: Pupils equal, round, reactive to light. No scleral icterus. Extraocular muscles intact.  HEENT: Head atraumatic, normocephalic. NECK: Supple, no jugular venous distention. No thyroid enlargement, no tenderness.  LUNGS: Normal breath sounds bilaterally, no wheezing. Mild crackles on left base with use of accessory muscles of respiration. When I saw- he was on nasal canula and in distress , started back on HFNC. CARDIOVASCULAR: S1, S2 normal. No murmurs, rubs, or gallops.  ABDOMEN: Soft, nontender, nondistended. Bowel sounds present. No organomegaly or mass.  EXTREMITIES: No pedal edema, cyanosis, or clubbing.  NEUROLOGIC: follows commands, moves all 4 limbs- power 5/5. PSYCHIATRIC:alert and oriented x 3.  SKIN: No obvious rash, lesion, or ulcer.   DATA REVIEW:   CBC  Recent Labs Lab 07/25/15 0616  WBC 4.2  HGB 9.6*  HCT 29.9*  PLT 360    Chemistries   Recent Labs Lab 07/23/15 0606  NA 145  K 4.3  CL 109  CO2 32  GLUCOSE 121*  BUN 18  CREATININE 0.70  CALCIUM 8.9  MG 2.5*    Cardiac Enzymes No results for input(s): TROPONINI in the last 168 hours.  Microbiology Results  Results for orders placed or performed during the hospital encounter of 07/17/15  Blood culture (routine x 2)     Status: None   Collection Time: 07/17/15  2:50 PM  Result Value Ref Range Status   Specimen Description BLOOD RIGHT WRIST  Final   Special Requests BAA,5MLAER,5MLANA  Final   Culture NO GROWTH 5 DAYS  Final   Report Status 07/22/2015 FINAL  Final  Blood culture (routine x 2)     Status: None   Collection Time: 07/17/15  3:05 PM  Result Value Ref Range Status   Specimen Description BLOOD LEFT WRIST  Final   Special Requests BAA,5MLAER,5MLANA  Final   Culture NO GROWTH 5 DAYS  Final   Report Status 07/22/2015 FINAL  Final  MRSA PCR Screening     Status: None   Collection Time: 07/17/15  9:27 PM  Result Value Ref Range Status    MRSA by PCR NEGATIVE NEGATIVE Final    Comment:        The GeneXpert MRSA Assay (FDA approved for NASAL specimens only), is one component of a comprehensive MRSA colonization surveillance program. It is not intended to diagnose MRSA infection nor to guide or monitor treatment for MRSA infections.  MRSA PCR Screening     Status: None   Collection Time: 07/21/15  3:57 AM  Result Value Ref Range Status   MRSA by PCR NEGATIVE NEGATIVE Final    Comment:        The GeneXpert MRSA Assay (FDA approved for NASAL specimens only), is one component of a comprehensive MRSA colonization surveillance program. It is not intended to diagnose MRSA infection nor to guide or monitor treatment for MRSA infections.   Culture, blood (routine x 2)     Status: None (Preliminary result)   Collection Time: 07/21/15  7:42 AM  Result Value Ref Range Status   Specimen Description BLOOD RIGHT ARM  Final   Special Requests BOTTLES DRAWN AEROBIC AND ANAEROBIC 6CC  Final   Culture NO GROWTH 4 DAYS  Final   Report Status PENDING  Incomplete  Culture, blood (routine x 2)     Status: None (Preliminary result)   Collection Time: 07/21/15  7:45 AM  Result Value Ref Range Status   Specimen Description BLOOD RIGHT HAND  Final   Special Requests   Final    BOTTLES DRAWN AEROBIC AND ANAEROBIC  [place] ANAEROBIC 3CC AEROBIC   Culture NO GROWTH 4 DAYS  Final   Report Status PENDING  Incomplete    RADIOLOGY:  No results found.    Management plans discussed with the patient, family and they are in agreement.  CODE STATUS:     Code Status Orders        Start     Ordered   07/17/15 2031  Full code   Continuous     07/17/15 2030      TOTAL TIME TAKING CARE OF THIS PATIENT:35 minutes.    [name] M.D on 07/25/2015 at 3:12 PM  Between 7am to 6pm - Pager - [phone]  After 6pm go to [email or web address] - password [email or web address] [facility]  Office   [phone]  CC: Primary care physician; Dion Body, MD

## 2015-07-25 NOTE — Progress Notes (Signed)
ANTICOAGULATION CONSULT NOTE - Initial Consult  Pharmacy Consult for Apixaban Indication: pulmonary embolus  No Known Allergies  Patient Measurements: Height: 5\' 5"  (165.1 cm) (upon transfer) Weight: 180 lb 14.4 oz (82.056 kg) (upon transfer) IBW/kg (Calculated) : 61.5 Heparin Dosing Weight:   Vital Signs: Temp: 98 F (36.7 C) (08/08 0748) Temp Source: Oral (08/08 0748) BP: 130/73 mmHg (08/08 1100) Pulse Rate: 97 (08/08 1100)  Labs:  Recent Labs  07/23/15 0606 07/23/15 1301 07/24/15 0331 07/25/15 0616  HGB 10.1*  --  9.6* 9.6*  HCT 30.7*  --  29.0* 29.9*  PLT 403  --  406 360  APTT 114* 74* 89* 28  HEPARINUNFRC 1.06* 0.81* 0.61 0.10*  CREATININE 0.70  --   --   --     Estimated Creatinine Clearance: 111.3 mL/min (by C-G formula based on Cr of 0.7).   Medical History: Past Medical History  Diagnosis Date  . Myotonic dystrophy   . Diabetes mellitus without complication     TYPE ONE    Medications:  Scheduled:  . antiseptic oral rinse  7 mL Mouth Rinse q12n4p  . apixaban  10 mg Oral BID  . [START ON 08/01/2015] apixaban  5 mg Oral BID  . aspirin  162.5 mg Oral QHS  . budesonide (PULMICORT) nebulizer solution  0.5 mg Nebulization BID  . chlorhexidine gluconate  15 mL Mouth Rinse BID  . docusate sodium  100 mg Oral BID  . feeding supplement (ENSURE ENLIVE)  237 mL Oral BID BM  . fenofibrate  160 mg Oral Daily  . insulin aspart  0-5 Units Subcutaneous QHS  . insulin aspart  0-9 Units Subcutaneous TID WC  . insulin aspart  2 Units Subcutaneous TID WC  . insulin aspart  3 Units Subcutaneous TID WC  . insulin glargine  10 Units Subcutaneous QHS  . insulin glargine  15 Units Subcutaneous QHS  . ipratropium-albuterol  3 mL Nebulization Q4H  . pravastatin  40 mg Oral QHS    Assessment: Patient being treated for pulmonary embolus. Previously on apixaban 10 mg BID started on 8/3. Patient was then started on heparin therapy.  Goal of Therapy:   Monitor  platelets by anticoagulation protocol: Yes   Plan:  Will give apixaban 10 mg PO BID x 7 days then apixaban 5 mg PO BID.   Bradley Bradley D 07/25/2015,11:11 AM

## 2015-07-25 NOTE — Progress Notes (Signed)
Transferred from bed to stretcher with [facility] staff and RN. Patient A&Ox4 and denies pain. PRN cough med given to patient just prior to him leaving. VSS. Wife and parents on the way to meet patient at hospital. Patient left ICU by stretcher on 6L nasal cannula and cardiac monitor with [facility] staff. Discharge packet given to Micheal form [facility]. Discharged to [facility] in [place], [place].

## 2015-07-25 NOTE — Progress Notes (Signed)
Report called to [place] at [facility]	[place].

## 2015-07-26 ENCOUNTER — Other Ambulatory Visit (HOSPITAL_COMMUNITY): Payer: Self-pay

## 2015-07-26 LAB — CULTURE, BLOOD (ROUTINE X 2)
Culture: NO GROWTH
Culture: NO GROWTH

## 2015-07-27 LAB — POTASSIUM: Potassium: 5.3 mmol/L — ABNORMAL HIGH (ref 3.5–5.1)

## 2015-07-29 ENCOUNTER — Other Ambulatory Visit (HOSPITAL_COMMUNITY): Payer: Medicare Other

## 2015-07-29 LAB — CBC WITH DIFFERENTIAL/PLATELET
Basophils Absolute: 0 10*3/uL (ref 0.0–0.1)
Basophils Relative: 1 % (ref 0–1)
Eosinophils Absolute: 0.2 10*3/uL (ref 0.0–0.7)
Eosinophils Relative: 3 % (ref 0–5)
HCT: 39.7 % (ref 39.0–52.0)
HEMOGLOBIN: 12.7 g/dL — AB (ref 13.0–17.0)
LYMPHS PCT: 33 % (ref 12–46)
Lymphs Abs: 2.3 10*3/uL (ref 0.7–4.0)
MCH: 27.5 pg (ref 26.0–34.0)
MCHC: 32 g/dL (ref 30.0–36.0)
MCV: 85.9 fL (ref 78.0–100.0)
MONO ABS: 0.9 10*3/uL (ref 0.1–1.0)
Monocytes Relative: 13 % — ABNORMAL HIGH (ref 3–12)
NEUTROS PCT: 50 % (ref 43–77)
Neutro Abs: 3.5 10*3/uL (ref 1.7–7.7)
Platelets: 487 10*3/uL — ABNORMAL HIGH (ref 150–400)
RBC: 4.62 MIL/uL (ref 4.22–5.81)
RDW: 14.9 % (ref 11.5–15.5)
WBC: 6.8 10*3/uL (ref 4.0–10.5)

## 2015-07-29 LAB — BASIC METABOLIC PANEL
Anion gap: 11 (ref 5–15)
BUN: 19 mg/dL (ref 6–20)
CO2: 29 mmol/L (ref 22–32)
Calcium: 10.3 mg/dL (ref 8.9–10.3)
Chloride: 100 mmol/L — ABNORMAL LOW (ref 101–111)
Creatinine, Ser: 0.79 mg/dL (ref 0.61–1.24)
GFR calc Af Amer: >60 mL/min (ref >60)
Glucose, Bld: 73 mg/dL (ref 65–99)
POTASSIUM: 4.7 mmol/L (ref 3.5–5.1)
Sodium: 140 mmol/L (ref 135–145)

## 2015-08-01 LAB — BASIC METABOLIC PANEL
Anion gap: 10 (ref 5–15)
BUN: 21 mg/dL — AB (ref 6–20)
CHLORIDE: 102 mmol/L (ref 101–111)
CO2: 28 mmol/L (ref 22–32)
Calcium: 9.9 mg/dL (ref 8.9–10.3)
Creatinine, Ser: 0.74 mg/dL (ref 0.61–1.24)
GFR calc Af Amer: >60 mL/min (ref >60)
GFR calc non Af Amer: >60 mL/min (ref >60)
GLUCOSE: 144 mg/dL — AB (ref 65–99)
POTASSIUM: 4.7 mmol/L (ref 3.5–5.1)
Sodium: 140 mmol/L (ref 135–145)

## 2015-08-01 LAB — CBC
HCT: 39.4 % (ref 39.0–52.0)
HEMOGLOBIN: 12.6 g/dL — AB (ref 13.0–17.0)
MCH: 27.6 pg (ref 26.0–34.0)
MCHC: 32 g/dL (ref 30.0–36.0)
MCV: 86.2 fL (ref 78.0–100.0)
Platelets: 431 10*3/uL — ABNORMAL HIGH (ref 150–400)
RBC: 4.57 MIL/uL (ref 4.22–5.81)
RDW: 15.1 % (ref 11.5–15.5)
WBC: 5.6 10*3/uL (ref 4.0–10.5)

## 2015-09-21 NOTE — Progress Notes (Signed)
Subjective:  Doing reasonably well still has shortness of breath weakness history of pulmonary embolism elevated troponin.  Patient complains of progressive shortness of breath and dyspnea has history of myotonic dystrophy  Objective:  Vital Signs in the last 24 hours:    Intake/Output from previous day:   Intake/Output from this shift:    Physical Exam: General appearance: cooperative, appears stated age and slowed mentation Neck: no adenopathy, no carotid bruit, no JVD, supple, symmetrical, trachea midline and thyroid not enlarged, symmetric, no tenderness/mass/nodules Lungs: diminished breath sounds bilaterally Heart: irregularly irregular rhythm and S1, S2 normal Abdomen: soft, non-tender; bowel sounds normal; no masses,  no organomegaly Extremities: Generalized weakness normal pulses Pulses: 2+ and symmetric Skin: Skin color, texture, turgor normal. No rashes or lesions Neurologic: Mental status: Alert, oriented, thought content appropriate Sensory: normal Motor: Generalized weakness partial paralysis Coordination: Generalized poor coordination with weakness Gait: Abnormal  Lab Results: No results for input(s): WBC, HGB, PLT in the last 72 hours. No results for input(s): NA, K, CL, CO2, GLUCOSE, BUN, CREATININE in the last 72 hours. No results for input(s): TROPONINI in the last 72 hours.  Invalid input(s): CK, MB Hepatic Function Panel No results for input(s): PROT, ALBUMIN, AST, ALT, ALKPHOS, BILITOT, BILIDIR, IBILI in the last 72 hours. No results for input(s): CHOL in the last 72 hours. No results for input(s): PROTIME in the last 72 hours.  Imaging: Imaging results have been reviewed  Cardiac Studies:  Assessment/Plan:  Atrial Fibrillation Cardiomyopathy CHF Edema Palpitations Shortness of Breath  Myotonic dystrophy  weakness  pulmonary embolus  diabetes  respiratory failure . PLAN  continue respiratory support  supplemental oxygen for shortness of  breath  the anticoagulation with Eliquis for pulmonary embolus  continue diabetes management with insulin and NovoLog and Lantus continue Prevacid for reflux symptoms Myotonic dystrophy continue current therapy  broad-spectrum antibiotic therapy  LOS: 8 days    Amarea Macdowell D. 09/21/2015, 4:47 PM

## 2016-07-03 IMAGING — CT CT ANGIO CHEST
1 of 2 series · 18 of 30 positions shown · IV contrast (APPLIED)
Comparison: None.

CLINICAL DATA: Myotonic dystrophy. Weakness. Dizziness. Nausea and
hyperglycemia. Initial encounter. Pulmonary embolism.

EXAM:
CT ANGIOGRAPHY CHEST WITH CONTRAST
TECHNIQUE: Multidetector CT imaging of the chest was performed using the
standard protocol during bolus administration of intravenous
contrast. Multiplanar CT image reconstructions and MIPs were
obtained to evaluate the vascular anatomy.
CONTRAST:  75mL OMNIPAQUE IOHEXOL 350 MG/ML SOLN

[Series 5: pe 1.0 thins · axial · 0.84mm/px · z∈[-268,-61]mm · 18 of 233 slices shown]
[im 13/233  lung]
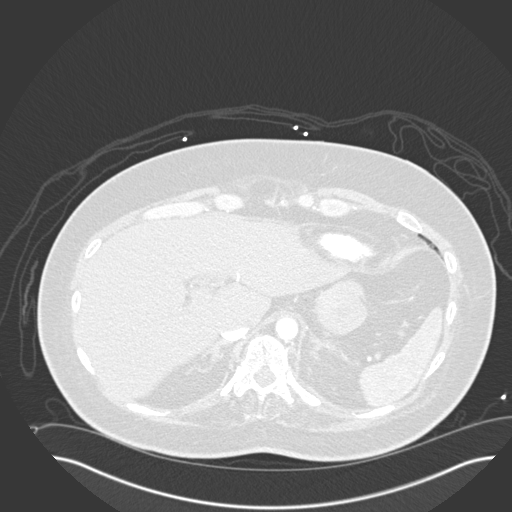
[im 26/233  mediastinal]
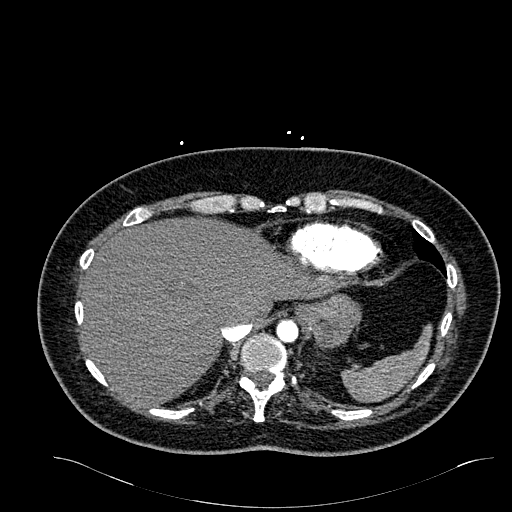
[im 39/233  lung]
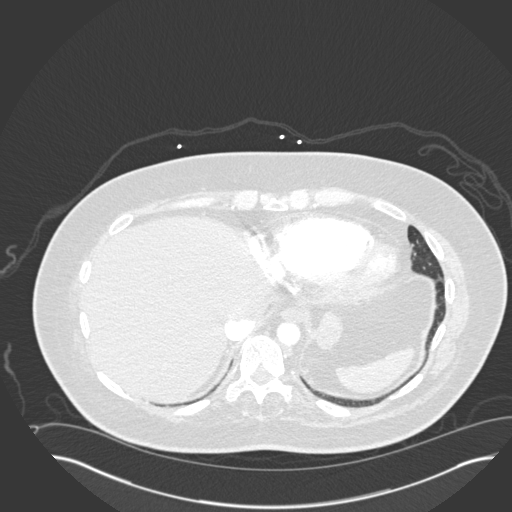
[im 52/233  mediastinal]
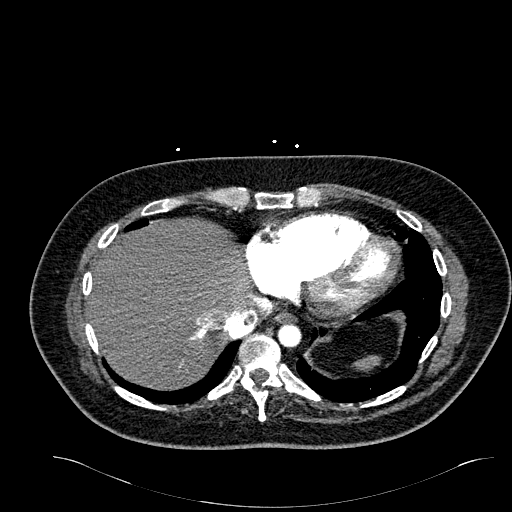
[im 65/233  lung]
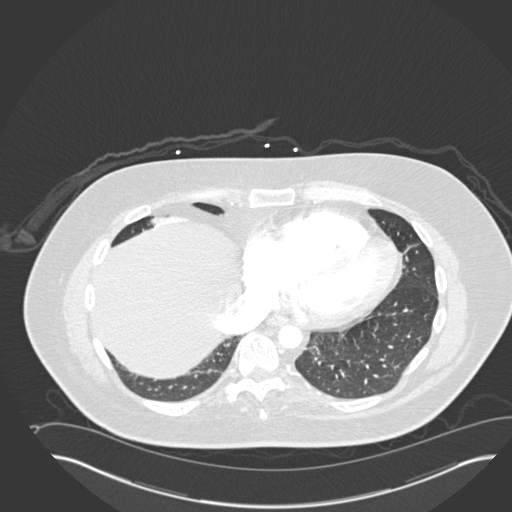
[im 78/233  mediastinal]
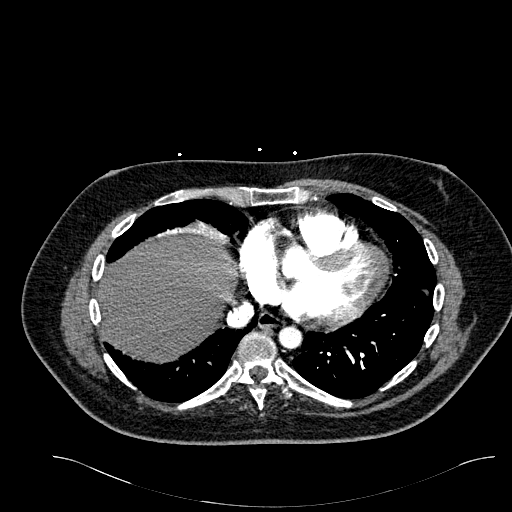
[im 91/233  lung]
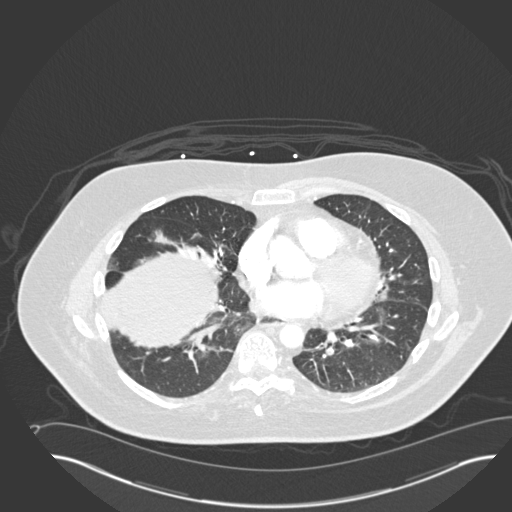
[im 104/233  mediastinal]
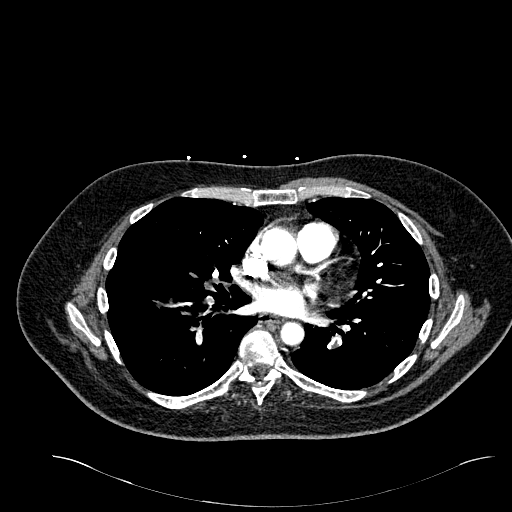
[im 107/233  lung]
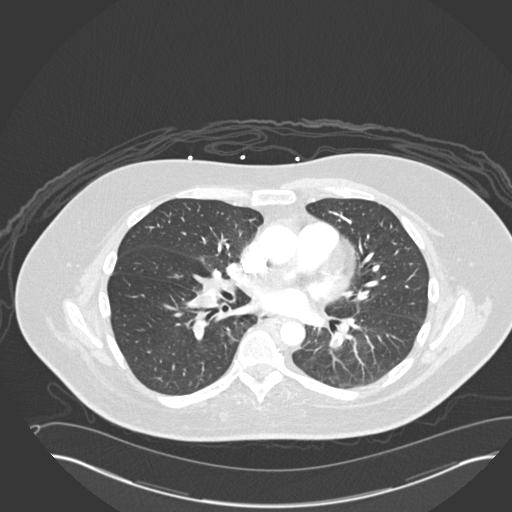
[im 117/233  mediastinal]
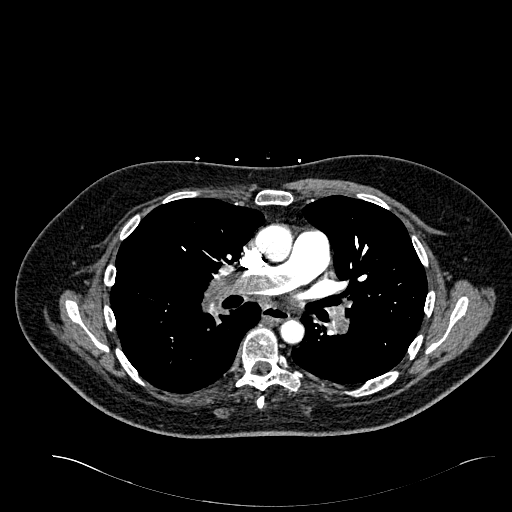
[im 129/233  lung]
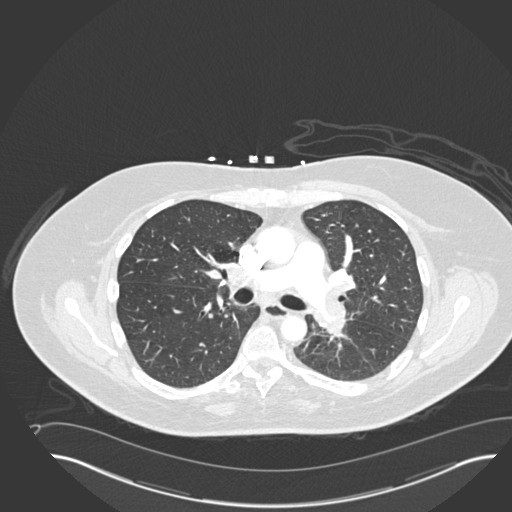
[im 142/233  mediastinal]
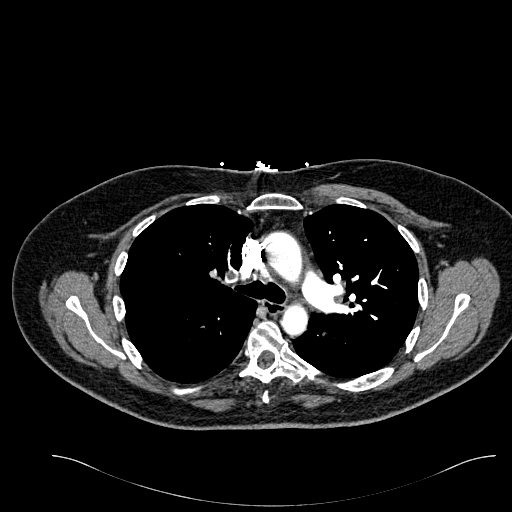
[im 155/233  lung]
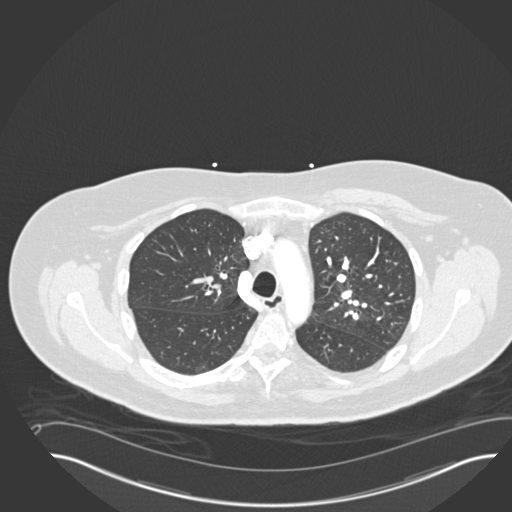
[im 168/233  mediastinal]
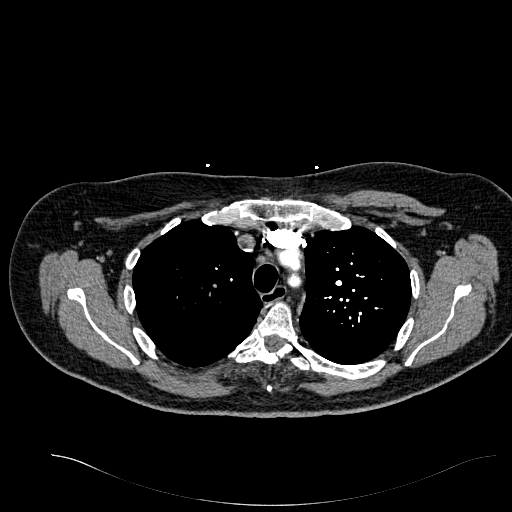
[im 181/233  lung]
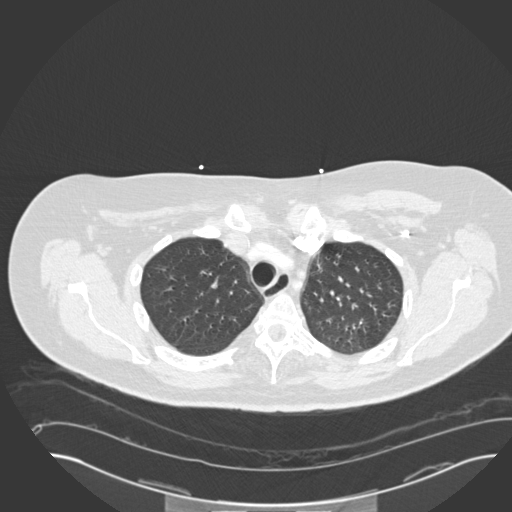
[im 194/233  mediastinal]
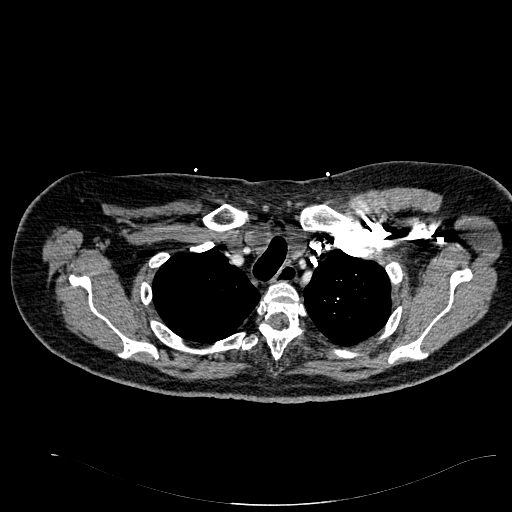
[im 207/233  lung]
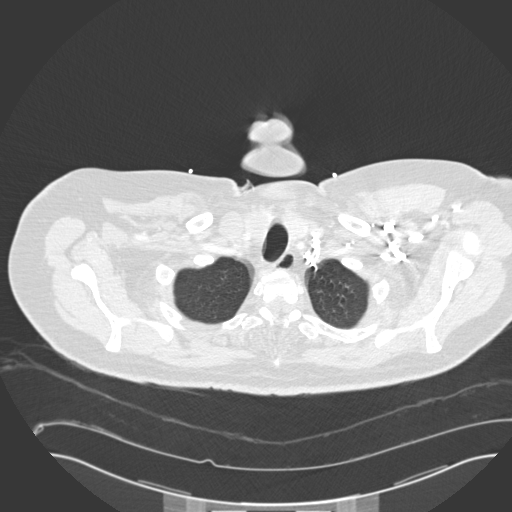
[im 220/233  mediastinal]
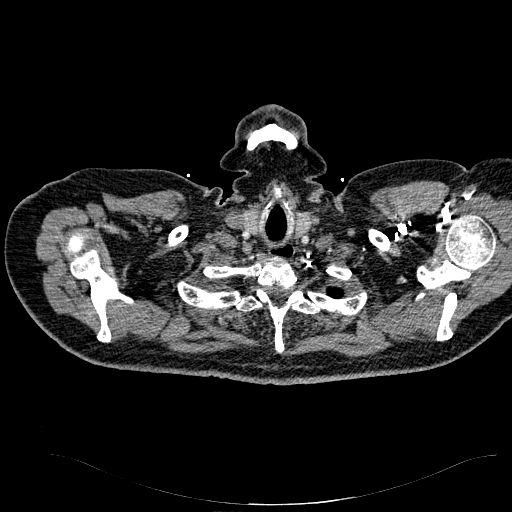

[18 of 30 positions shown; findings below may reference images not displayed]

FINDINGS: Bones: No aggressive osseous lesions. dextroconvex thoracic
scoliosis. Scattered Schmorl's nodes throughout the thoracic spine.
Sternoclavicular joint osteoarthritis incidentally noted. Marked
paraspinal muscular atrophy, compatible with myotonic dystrophy.

Cardiovascular: Large bilateral pulmonary emboli are present,
straddling the bifurcation of the LEFT and RIGHT pulmonary arteries.
Embolus is present in all lobes of both lungs. There is also RIGHT
heart strain. The LEFT ventricle measures 26 mm and the RIGHT
ventricle measures 43 mm.

Lungs: Basilar atelectasis. Small focus of ground-glass attenuation
in the anterior RIGHT upper lobe potentially represents pulmonary
infarction.

Central airways: Patent.

Effusions: None.

Lymphadenopathy: No axillary adenopathy. No mediastinal or hilar
adenopathy.

Esophagus: Normal.

Upper abdomen: Either fat containing subxiphoid hernia or a laxity
of the anterior abdominal wall in the subxiphoid region. This area
is only partially visualized. No gross acute abnormality in the
upper abdomen.

Other: None.

Review of the MIP images confirms the above findings.
IMPRESSION: 1. Bilateral pulmonary emboli. Positive for acute PE with CT
evidence of right heart strain (RV/LV Ratio = 1.65) consistent with
at least submassive (intermediate risk) PE. The presence of right
heart strain has been associated with an increased risk of morbidity
and mortality. Please activate Code PE by paging [phone].
2. Probable small pulmonary infarct in the peripheral RIGHT upper
lobe.
3. Low volumes with basilar atelectasis.
4. Marked paraspinal muscular atrophy compatible with history of
myotonic dystrophy.
5. Critical Value/emergent results were called by telephone at the
time of interpretation on 07/17/2015 at [DATE] to charge nurse Overman
Francina, who verbally acknowledged these results.

## 2016-07-12 IMAGING — CR DG CHEST 1V PORT
1 series · 1 of 1 positions shown · non-contrast
Comparison: Portable chest x-ray July 20, 2015

CLINICAL DATA: Aspiration

EXAM:
PORTABLE CHEST - 1 VIEW

[AP]
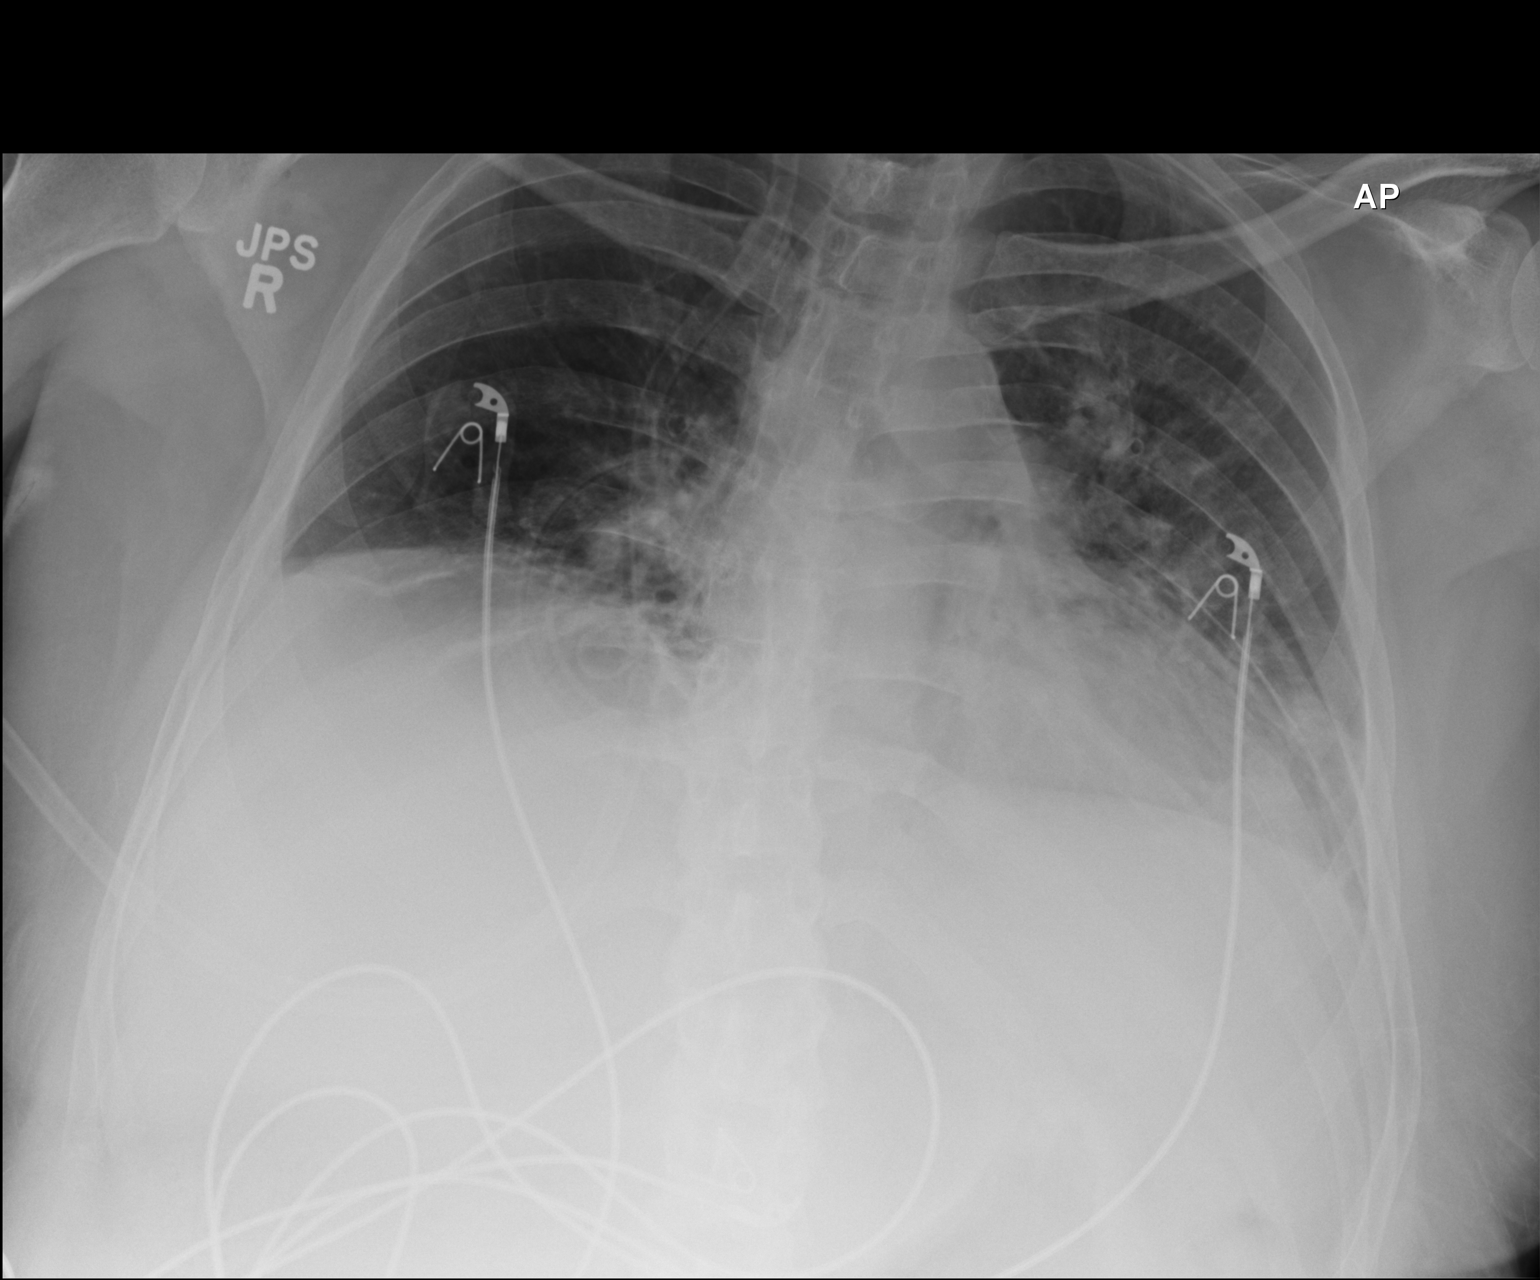

[1 of 1 positions shown; findings below may reference images not displayed]

FINDINGS: The lung volumes remain low. There is persistent bibasilar
atelectasis. Patchy perihilar increased density on the left persists
as well. The cardiac silhouette is enlarged. The pulmonary
vascularity is not engorged. A small right pleural effusion is
suspected. The observed bony thorax is unremarkable.
IMPRESSION: Persistent bibasilar and left perihilar atelectasis or infiltrate.
Overall there has not been dramatic interval change since the
previous study.

## 2017-10-11 ENCOUNTER — Encounter: Payer: Self-pay | Admitting: *Deleted

## 2017-10-14 ENCOUNTER — Ambulatory Visit: Payer: Medicare Other | Admitting: Anesthesiology

## 2017-10-14 ENCOUNTER — Encounter
Admission: RE | Disposition: A | Discharge status: Home / Self Care | Payer: Self-pay | Source: Ambulatory Visit | Attending: Unknown Physician Specialty

## 2017-10-14 ENCOUNTER — Ambulatory Visit
Admission: RE | Admit: 2017-10-14 | Discharge: 2017-10-14 | Disposition: A | Discharge status: Home / Self Care | Payer: Medicare Other | Source: Ambulatory Visit | Attending: Unknown Physician Specialty | Admitting: Unknown Physician Specialty

## 2017-10-14 DIAGNOSIS — E782 Mixed hyperlipidemia: Secondary | ICD-10-CM | POA: Insufficient documentation

## 2017-10-14 DIAGNOSIS — I10 Essential (primary) hypertension: Secondary | ICD-10-CM | POA: Diagnosis not present

## 2017-10-14 DIAGNOSIS — J45909 Unspecified asthma, uncomplicated: Secondary | ICD-10-CM | POA: Insufficient documentation

## 2017-10-14 DIAGNOSIS — D123 Benign neoplasm of transverse colon: Secondary | ICD-10-CM | POA: Insufficient documentation

## 2017-10-14 DIAGNOSIS — K621 Rectal polyp: Secondary | ICD-10-CM | POA: Insufficient documentation

## 2017-10-14 DIAGNOSIS — Z7901 Long term (current) use of anticoagulants: Secondary | ICD-10-CM | POA: Insufficient documentation

## 2017-10-14 DIAGNOSIS — Z794 Long term (current) use of insulin: Secondary | ICD-10-CM | POA: Insufficient documentation

## 2017-10-14 DIAGNOSIS — Z1211 Encounter for screening for malignant neoplasm of colon: Secondary | ICD-10-CM | POA: Insufficient documentation

## 2017-10-14 DIAGNOSIS — Q438 Other specified congenital malformations of intestine: Secondary | ICD-10-CM | POA: Insufficient documentation

## 2017-10-14 DIAGNOSIS — D1779 Benign lipomatous neoplasm of other sites: Secondary | ICD-10-CM | POA: Insufficient documentation

## 2017-10-14 DIAGNOSIS — Z79899 Other long term (current) drug therapy: Secondary | ICD-10-CM | POA: Diagnosis not present

## 2017-10-14 DIAGNOSIS — E119 Type 2 diabetes mellitus without complications: Secondary | ICD-10-CM | POA: Insufficient documentation

## 2017-10-14 DIAGNOSIS — K640 First degree hemorrhoids: Secondary | ICD-10-CM | POA: Diagnosis not present

## 2017-10-14 DIAGNOSIS — G7111 Myotonic muscular dystrophy: Secondary | ICD-10-CM | POA: Insufficient documentation

## 2017-10-14 DIAGNOSIS — Z86711 Personal history of pulmonary embolism: Secondary | ICD-10-CM | POA: Insufficient documentation

## 2017-10-14 DIAGNOSIS — Z87891 Personal history of nicotine dependence: Secondary | ICD-10-CM | POA: Insufficient documentation

## 2017-10-14 HISTORY — DX: Other pulmonary embolism without acute cor pulmonale: I26.99

## 2017-10-14 HISTORY — DX: Essential (primary) hypertension: I10

## 2017-10-14 HISTORY — DX: Mixed hyperlipidemia: E78.2

## 2017-10-14 HISTORY — PX: COLONOSCOPY WITH PROPOFOL: SHX5780

## 2017-10-14 LAB — GLUCOSE, CAPILLARY: Glucose-Capillary: 88 mg/dL (ref 65–99)

## 2017-10-14 SURGERY — COLONOSCOPY WITH PROPOFOL
Anesthesia: General

## 2017-10-14 MED ORDER — LIDOCAINE HCL (PF) 2 % IJ SOLN
INTRAMUSCULAR | Status: AC
  Filled 2017-10-14: qty 10

## 2017-10-14 MED ORDER — SODIUM CHLORIDE 0.9 % IV SOLN: INTRAVENOUS | Status: DC

## 2017-10-14 MED ORDER — SODIUM CHLORIDE 0.9 % IV SOLN
INTRAVENOUS | Status: DC
  Administered 2017-10-14: 1000 mL via INTRAVENOUS

## 2017-10-14 MED ORDER — PROPOFOL 10 MG/ML IV BOLUS
INTRAVENOUS | Status: DC | PRN
  Administered 2017-10-14: 70 mg via INTRAVENOUS

## 2017-10-14 MED ORDER — PROPOFOL 10 MG/ML IV BOLUS
INTRAVENOUS | Status: AC
  Filled 2017-10-14: qty 20

## 2017-10-14 MED ORDER — PROPOFOL 500 MG/50ML IV EMUL
INTRAVENOUS | Status: AC
  Filled 2017-10-14: qty 50

## 2017-10-14 MED ORDER — PROPOFOL 500 MG/50ML IV EMUL
INTRAVENOUS | Status: DC | PRN
  Administered 2017-10-14: 200 ug/kg/min via INTRAVENOUS

## 2017-10-14 MED ORDER — LIDOCAINE 2% (20 MG/ML) 5 ML SYRINGE
INTRAMUSCULAR | Status: DC | PRN
  Administered 2017-10-14: 50 mg via INTRAVENOUS

## 2017-10-14 NOTE — Op Note (Signed)
[facility] Patient Name: Bradley Bradley Procedure Date: 10/14/2017 7:23 AM MRN: [id number] Account #: [id number] Date of Birth: 08-23-1966 Admit Type: Outpatient Age: 51 Room: [facility] ENDO ROOM 1 Gender: Male Note Status: Finalized Procedure:            Colonoscopy Indications:          Screening for colorectal malignant neoplasm Providers:            Manya Silvas, MD Referring MD:         Dion Body (Referring MD) Medicines:            Propofol per Anesthesia Complications:        No immediate complications. Procedure:            Pre-Anesthesia Assessment:                       - After reviewing the risks and benefits, the patient                        was deemed in satisfactory condition to undergo the                        procedure.                       After obtaining informed consent, the colonoscope was                        passed under direct vision. Throughout the procedure,                        the patient's blood pressure, pulse, and oxygen                        saturations were monitored continuously. The                        Colonoscope was introduced through the anus and                        advanced to the the cecum, identified by appendiceal                        orifice and ileocecal valve. The colonoscopy was                        somewhat difficult due to significant looping.                        Successful completion of the procedure was aided by                        applying abdominal pressure. The patient tolerated the                        procedure well. The quality of the bowel preparation                        was good. Findings:      A medium polyp was found in the proximal transverse colon. The polyp was  sessile. The polyp was removed with a piecemeal technique using a hot       snare. Resection and retrieval were complete. To prevent bleeding after       the polypectomy,  five hemostatic clips were successfully placed. There       was no bleeding during, or at the end, of the procedure.      A diminutive polyp was found in the rectum. The polyp was sessile. The       polyp was removed with a hot snare. Resection and retrieval were       complete.      Internal hemorrhoids were found during endoscopy. The hemorrhoids were       small and Grade I (internal hemorrhoids that do not prolapse).      There was a small- medium-sized lipoma, in the ascending colon. Harmless       and left alone. Impression:           - One medium polyp in the proximal transverse colon,                        removed piecemeal using a hot snare. Resected and                        retrieved. Clips were placed.                       - One diminutive polyp in the rectum, removed with a                        hot snare. Resected and retrieved.                       - Internal hemorrhoids.                       - Medium-sized lipoma in the ascending colon. Recommendation:       - Await pathology results. Manya Silvas, MD 10/14/2017 8:23:21 AM This report has been signed electronically. Number of Addenda: 0 Note Initiated On: 10/14/2017 7:23 AM Scope Withdrawal Time: 0 hours 17 minutes 44 seconds  Total Procedure Duration: 0 hours 36 minutes 22 seconds       [facility]

## 2017-10-14 NOTE — H&P (Signed)
Primary Care Physician:  Dion Body, MD Primary Gastroenterologist:  Dr. Vira Agar  Pre-Procedure History & Physical: HPI:  Bradley Bradley is a 51 y.o. male is here for an colonoscopy.   Past Medical History:  Diagnosis Date  . Diabetes mellitus without complication (HCC)    TYPE ONE  . Hypertension   . Mixed hyperlipidemia   . 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 Pulmonary embolism [facility])     Past Surgical History:  Procedure Laterality Date  . APPENDECTOMY    . DENTAL SURGERY    . EYE SURGERY    . JOINT REPLACEMENT      Prior to Admission medications   Medication Sig Start Date End Date Taking? Authorizing Provider  apixaban (ELIQUIS) 5 MG TABS tablet Take 2 tablets (10 mg total) by mouth 2 (two) times daily. 07/25/15  Yes [name], MD  apixaban (ELIQUIS) 5 MG TABS tablet Take 1 tablet (5 mg total) by mouth 2 (two) times daily. 08/01/15  Yes [name], MD  atorvastatin (LIPITOR) 10 MG tablet Take 10 mg by mouth at bedtime.   Yes [provider]  azelastine (ASTELIN) 0.1 % nasal spray Place 2 sprays into both nostrils 2 (two) times daily as needed for rhinitis. Use in each nostril as directed   Yes [provider]  b complex vitamins tablet Take 1 tablet by mouth at bedtime.   Yes [provider]  budesonide (PULMICORT) 0.5 MG/2ML nebulizer solution Take 2 mLs (0.5 mg total) by nebulization 2 (two) times daily. 07/25/15  Yes [name], MD  Cinnamon 500 MG capsule Take 500 mg by mouth every other day.    Yes [provider]  Coenzyme Q10 (CO Q 10 PO) Take 1 tablet by mouth at bedtime.   Yes [provider]  feeding supplement, ENSURE ENLIVE, (ENSURE ENLIVE) LIQD Take 237 mLs by mouth 2 (two) times daily between meals. 07/25/15  Yes [name], MD  fenofibrate (TRICOR) 145 MG tablet Take 145 mg by mouth at bedtime.   Yes [provider]  Ginger, Zingiber officinalis, 500 MG  CAPS Take 1 capsule by mouth every other day.   Yes [provider]  glucosamine-chondroitin 500-400 MG tablet Take 0.5 tablets by mouth at bedtime.   Yes [provider]  insulin aspart (NOVOLOG) 100 UNIT/ML injection Inject 3 Units into the skin 3 (three) times daily with meals. Patient taking differently: Inject 12 Units into the skin 3 (three) times daily with meals.  07/25/15  Yes [name], MD  insulin glargine (LANTUS) 100 UNIT/ML injection Inject 0.15 mLs (15 Units total) into the skin at bedtime. 07/25/15  Yes [name], MD  insulin NPH Human (HUMULIN N,NOVOLIN N) 100 UNIT/ML injection Inject 12 Units into the skin daily before breakfast.    Yes [provider]  ipratropium-albuterol (DUONEB) 0.5-2.5 (3) MG/3ML SOLN Take 3 mLs by nebulization every 4 (four) hours. 07/25/15  Yes [name], MD  lansoprazole (PREVACID) 15 MG capsule Take 15 mg by mouth daily at 12 noon.   Yes [provider]  lisinopril-hydrochlorothiazide (PRINZIDE,ZESTORETIC) 10-12.5 MG tablet Take by mouth daily.   Yes [provider]  metFORMIN (GLUCOPHAGE) 500 MG tablet Take 1,000 mg by mouth 2 (two) times daily with a meal.    Yes [provider]  metoprolol tartrate (LOPRESSOR) 25 MG tablet Take 12.5 mg by mouth 2 (two) times daily.   Yes [provider]  Multiple Vitamin (MULTIVITAMIN) tablet Take 1 tablet by  mouth at bedtime.   Yes [provider]  omeprazole (PRILOSEC) 20 MG capsule Take 20 mg by mouth daily.   Yes [provider]  oxyCODONE (OXY IR/ROXICODONE) 5 MG immediate release tablet Take 1 tablet (5 mg total) by mouth every 4 (four) hours as needed for moderate pain. 07/25/15  Yes [name], MD  pravastatin (PRAVACHOL) 40 MG tablet Take 40 mg by mouth at bedtime.   Yes [provider]  vitamin B-12 (CYANOCOBALAMIN) 500 MCG tablet Take 500 mcg by mouth daily.   Yes [provider]    Allergies as of 07/19/2017  . (No Known Allergies)    Family History  Problem Relation Age of Onset  . Pulmonary embolism Brother     Social History   Social History  . Marital status: Married    Spouse name: N/A  . Number of children: N/A  . Years of education: N/A   Occupational History  . Not on file.   Social History Main Topics  . Smoking status: Former Research scientist (life sciences)  . Smokeless tobacco: Never Used  . Alcohol use Yes     Comment: occasionally  . Drug use: Unknown  . Sexual activity: Not on file   Other Topics Concern  . Not on file   Social History Narrative  . No narrative on file    Review of Systems: See HPI, otherwise negative ROS  Physical Exam: BP (!) 155/87   Pulse 82   Temp 97.7 F (36.5 C) (Tympanic)   Resp 16   Ht 5\' 5"  (1.651 m)   Wt 78.5 kg (173 lb)   SpO2 95%   BMI 28.79 kg/m  General:   Alert,  pleasant and cooperative in NAD Head:  Normocephalic and atraumatic. Neck:  Supple; no masses or thyromegaly. Lungs:  Clear throughout to auscultation.    Heart:  Regular rate and rhythm. Abdomen:  Soft, nontender and nondistended. Normal bowel sounds, without guarding, and without rebound.   Neurologic:  Alert and  oriented x4;  grossly normal neurologically.  Impression/Plan: Bradley Bradley is here for an colonoscopy to be performed for colon cancer screening  Risks, benefits, limitations, and alternatives regarding  colonoscopy have been reviewed with the patient.  Questions have been answered.  All parties agreeable.   Gaylyn Cheers, MD  10/14/2017, 7:34 AM

## 2017-10-14 NOTE — Anesthesia Postprocedure Evaluation (Signed)
Anesthesia Post Note  Patient: Bradley Bradley  Procedure(s) Performed: COLONOSCOPY WITH PROPOFOL (N/A )  Patient location during evaluation: PACU Anesthesia Type: General Level of consciousness: awake Pain management: pain level controlled Vital Signs Assessment: post-procedure vital signs reviewed and stable Respiratory status: spontaneous breathing Cardiovascular status: stable Anesthetic complications: no     Last Vitals:  Vitals:   10/14/17 0845 10/14/17 0855  BP: 132/84 136/73  Pulse: 60 61  Resp: 15 17  Temp:    SpO2: 93% 93%    Last Pain:  Vitals:   10/14/17 0825  TempSrc: Tympanic                 VAN STAVEREN,Cerina Leary

## 2017-10-14 NOTE — Anesthesia Preprocedure Evaluation (Signed)
Anesthesia Evaluation  Patient identified by MRN, date of birth, ID band Patient awake    Reviewed: Allergy & Precautions, NPO status , Patient's Chart, lab work & pertinent test results  Airway Mallampati: II       Dental  (+) Teeth Intact   Pulmonary asthma , former smoker,     + decreased breath sounds      Cardiovascular hypertension, Pt. on medications and Pt. on home beta blockers  Rhythm:Regular Rate:Normal     Neuro/Psych  Neuromuscular disease    GI/Hepatic negative GI ROS, Neg liver ROS,   Endo/Other  diabetes, Type 1, Insulin DependentMorbid obesity  Renal/GU negative Renal ROS     Musculoskeletal   Abdominal (+) + obese,   Peds negative pediatric ROS (+)  Hematology negative hematology ROS (+)   Anesthesia Other Findings   Reproductive/Obstetrics                             Anesthesia Physical Anesthesia Plan  ASA: III  Anesthesia Plan: General   Post-op Pain Management:    Induction: Intravenous  PONV Risk Score and Plan: 0  Airway Management Planned: Natural Airway and Nasal Cannula  Additional Equipment:   Intra-op Plan:   Post-operative Plan:   Informed Consent: I have reviewed the patients History and Physical, chart, labs and discussed the procedure including the risks, benefits and alternatives for the proposed anesthesia with the patient or authorized representative who has indicated his/her understanding and acceptance.     Plan Discussed with: CRNA  Anesthesia Plan Comments:         Anesthesia Quick Evaluation

## 2017-10-14 NOTE — Anesthesia Post-op Follow-up Note (Signed)
Anesthesia QCDR form completed.        

## 2017-10-14 NOTE — Transfer of Care (Signed)
Immediate Anesthesia Transfer of Care Note  Patient: Bradley Bradley  Procedure(s) Performed: COLONOSCOPY WITH PROPOFOL (N/A )  Patient Location: Endoscopy Unit  Anesthesia Type:General  Level of Consciousness: awake  Airway & Oxygen Therapy: Patient connected to nasal cannula oxygen  Post-op Assessment: Post -op Vital signs reviewed and stable  Post vital signs: stable  Last Vitals:  Vitals:   10/14/17 0823 10/14/17 0825  BP: 125/71 125/71  Pulse: 70 70  Resp:  19  Temp: (!) 35.7 C (!) 36.3 C  SpO2: 95% 93%    Last Pain:  Vitals:   10/14/17 0825  TempSrc: Tympanic         Complications: No apparent anesthesia complications

## 2017-10-15 LAB — SURGICAL PATHOLOGY

## 2017-10-16 ENCOUNTER — Encounter: Payer: Self-pay | Admitting: Unknown Physician Specialty

## 2018-05-29 ENCOUNTER — Encounter: Payer: Self-pay | Admitting: *Deleted

## 2018-05-30 ENCOUNTER — Encounter
Admission: RE | Disposition: A | Discharge status: Still In Hospital | Payer: Self-pay | Source: Ambulatory Visit | Attending: Unknown Physician Specialty

## 2018-05-30 ENCOUNTER — Ambulatory Visit: Payer: Medicare Other | Admitting: Anesthesiology

## 2018-05-30 ENCOUNTER — Encounter
Admission: RE | Disposition: A | Discharge status: Home / Self Care | Payer: Self-pay | Source: Ambulatory Visit | Attending: Unknown Physician Specialty

## 2018-05-30 ENCOUNTER — Encounter: Payer: Self-pay | Admitting: *Deleted

## 2018-05-30 ENCOUNTER — Ambulatory Visit
Admission: RE | Admit: 2018-05-30 | Discharge: 2018-05-30 | Disposition: A | Discharge status: Still In Hospital | Payer: Medicare Other | Source: Ambulatory Visit | Attending: Unknown Physician Specialty | Admitting: Unknown Physician Specialty

## 2018-05-30 ENCOUNTER — Ambulatory Visit
Admission: RE | Admit: 2018-05-30 | Discharge: 2018-05-30 | Disposition: A | Discharge status: Home / Self Care | Payer: Medicare Other | Source: Ambulatory Visit | Attending: Unknown Physician Specialty | Admitting: Unknown Physician Specialty

## 2018-05-30 DIAGNOSIS — Z79899 Other long term (current) drug therapy: Secondary | ICD-10-CM | POA: Insufficient documentation

## 2018-05-30 DIAGNOSIS — D175 Benign lipomatous neoplasm of intra-abdominal organs: Secondary | ICD-10-CM | POA: Diagnosis not present

## 2018-05-30 DIAGNOSIS — I10 Essential (primary) hypertension: Secondary | ICD-10-CM | POA: Diagnosis not present

## 2018-05-30 DIAGNOSIS — E119 Type 2 diabetes mellitus without complications: Secondary | ICD-10-CM | POA: Insufficient documentation

## 2018-05-30 DIAGNOSIS — Z87891 Personal history of nicotine dependence: Secondary | ICD-10-CM | POA: Diagnosis not present

## 2018-05-30 DIAGNOSIS — Z91048 Other nonmedicinal substance allergy status: Secondary | ICD-10-CM | POA: Diagnosis not present

## 2018-05-30 DIAGNOSIS — E782 Mixed hyperlipidemia: Secondary | ICD-10-CM | POA: Diagnosis not present

## 2018-05-30 DIAGNOSIS — D123 Benign neoplasm of transverse colon: Secondary | ICD-10-CM | POA: Insufficient documentation

## 2018-05-30 DIAGNOSIS — G7111 Myotonic muscular dystrophy: Secondary | ICD-10-CM | POA: Diagnosis not present

## 2018-05-30 DIAGNOSIS — D125 Benign neoplasm of sigmoid colon: Secondary | ICD-10-CM | POA: Insufficient documentation

## 2018-05-30 DIAGNOSIS — Z1211 Encounter for screening for malignant neoplasm of colon: Secondary | ICD-10-CM | POA: Diagnosis not present

## 2018-05-30 DIAGNOSIS — Z86010 Personal history of colonic polyps: Secondary | ICD-10-CM | POA: Diagnosis not present

## 2018-05-30 DIAGNOSIS — Z7901 Long term (current) use of anticoagulants: Secondary | ICD-10-CM | POA: Diagnosis not present

## 2018-05-30 DIAGNOSIS — Z794 Long term (current) use of insulin: Secondary | ICD-10-CM | POA: Diagnosis not present

## 2018-05-30 DIAGNOSIS — Z86711 Personal history of pulmonary embolism: Secondary | ICD-10-CM | POA: Diagnosis not present

## 2018-05-30 DIAGNOSIS — Z9660 Presence of unspecified orthopedic joint implant: Secondary | ICD-10-CM | POA: Insufficient documentation

## 2018-05-30 DIAGNOSIS — Z538 Procedure and treatment not carried out for other reasons: Secondary | ICD-10-CM | POA: Insufficient documentation

## 2018-05-30 DIAGNOSIS — Z888 Allergy status to other drugs, medicaments and biological substances status: Secondary | ICD-10-CM | POA: Diagnosis not present

## 2018-05-30 DIAGNOSIS — Z8249 Family history of ischemic heart disease and other diseases of the circulatory system: Secondary | ICD-10-CM | POA: Diagnosis not present

## 2018-05-30 HISTORY — PX: COLONOSCOPY WITH PROPOFOL: SHX5780

## 2018-05-30 LAB — GLUCOSE, CAPILLARY
GLUCOSE-CAPILLARY: 312 mg/dL — AB (ref 65–99)
Glucose-Capillary: 164 mg/dL — ABNORMAL HIGH (ref 65–99)
Glucose-Capillary: 366 mg/dL — ABNORMAL HIGH (ref 65–99)

## 2018-05-30 SURGERY — COLONOSCOPY WITH PROPOFOL
Anesthesia: General

## 2018-05-30 MED ORDER — EPHEDRINE SULFATE 50 MG/ML IJ SOLN
INTRAMUSCULAR | Status: AC
  Filled 2018-05-30: qty 1

## 2018-05-30 MED ORDER — PROPOFOL 500 MG/50ML IV EMUL
INTRAVENOUS | Status: AC
  Filled 2018-05-30: qty 50

## 2018-05-30 MED ORDER — PROPOFOL 10 MG/ML IV BOLUS
INTRAVENOUS | Status: DC | PRN
  Administered 2018-05-30: 30 mg via INTRAVENOUS
  Administered 2018-05-30: 20 mg via INTRAVENOUS

## 2018-05-30 MED ORDER — LIDOCAINE HCL (PF) 2 % IJ SOLN
INTRAMUSCULAR | Status: AC
  Filled 2018-05-30: qty 10

## 2018-05-30 MED ORDER — LIDOCAINE HCL (PF) 2 % IJ SOLN
INTRAMUSCULAR | Status: DC | PRN
  Administered 2018-05-30: 60 mg

## 2018-05-30 MED ORDER — MIDAZOLAM HCL 2 MG/2ML IJ SOLN
INTRAMUSCULAR | Status: AC
  Filled 2018-05-30: qty 2

## 2018-05-30 MED ORDER — SODIUM CHLORIDE 0.9 % IV SOLN
INTRAVENOUS | Status: DC
  Administered 2018-05-30: 08:00:00 via INTRAVENOUS

## 2018-05-30 MED ORDER — PROPOFOL 500 MG/50ML IV EMUL
INTRAVENOUS | Status: DC | PRN
  Administered 2018-05-30: 50 ug/kg/min via INTRAVENOUS

## 2018-05-30 MED ORDER — PHENYLEPHRINE HCL 10 MG/ML IJ SOLN
INTRAMUSCULAR | Status: AC
  Filled 2018-05-30: qty 1

## 2018-05-30 MED ORDER — INSULIN ASPART 100 UNIT/ML ~~LOC~~ SOLN
SUBCUTANEOUS | Status: AC
  Administered 2018-05-30: 5 [IU] via SUBCUTANEOUS
  Filled 2018-05-30: qty 1

## 2018-05-30 MED ORDER — FENTANYL CITRATE (PF) 100 MCG/2ML IJ SOLN
INTRAMUSCULAR | Status: AC
  Filled 2018-05-30: qty 2

## 2018-05-30 MED ORDER — PROPOFOL 10 MG/ML IV BOLUS
INTRAVENOUS | Status: DC | PRN
  Administered 2018-05-30 (×2): 20 mg via INTRAVENOUS
  Administered 2018-05-30: 10 mg via INTRAVENOUS

## 2018-05-30 MED ORDER — INSULIN ASPART 100 UNIT/ML ~~LOC~~ SOLN
SUBCUTANEOUS | Status: AC
  Administered 2018-05-30: 8 [IU]
  Filled 2018-05-30: qty 1

## 2018-05-30 MED ORDER — PROPOFOL 500 MG/50ML IV EMUL
INTRAVENOUS | Status: DC | PRN
  Administered 2018-05-30: 25 ug/kg/min via INTRAVENOUS

## 2018-05-30 MED ORDER — SODIUM CHLORIDE 0.9 % IV SOLN: INTRAVENOUS | Status: DC

## 2018-05-30 MED ORDER — SODIUM CHLORIDE 0.9 % IV SOLN
INTRAVENOUS | Status: DC | PRN
  Administered 2018-05-30: 15:00:00 via INTRAVENOUS

## 2018-05-30 MED ORDER — MIDAZOLAM HCL 5 MG/5ML IJ SOLN
INTRAMUSCULAR | Status: DC | PRN
  Administered 2018-05-30: 1 mg via INTRAVENOUS

## 2018-05-30 MED ORDER — LIDOCAINE HCL (PF) 2 % IJ SOLN
INTRAMUSCULAR | Status: AC
Start: 2018-05-30 — End: ?
  Filled 2018-05-30: qty 30

## 2018-05-30 MED ORDER — POLYETHYLENE GLYCOL 3350 17 GM/SCOOP PO POWD
1.0000 | Freq: Once | ORAL | Status: AC
  Administered 2018-05-30: 255 g via ORAL
  Filled 2018-05-30: qty 255

## 2018-05-30 NOTE — Op Note (Addendum)
[facility]enter[facility] Patient Name: Bradley Bradley Procedure Date: 05/30/2018 7:34 AM MRN: [id number] Account #: [id number] Date of Birth: 1966/02/11 Admit Type: Outpatient Age: 52 Room: [facility] ENDO ROOM 3 Gender: Male Note Status: Finalized Procedure:            Colonoscopy Providers:            Manya Silvas, MD Referring MD:         Dion Body (Referring MD) Complications:        No immediate complications. Procedure:            Pre-Anesthesia Assessment:                       - After reviewing the risks and benefits, the patient                        was deemed in satisfactory condition to undergo the                        procedure.                       After obtaining informed consent, the colonoscope was                        passed under direct vision. Throughout the procedure,                        the patient's blood pressure, pulse, and oxygen                        saturations were monitored continuously. The                        Colonoscope was introduced through the anus with the                        intention of advancing to the cecum. The scope was                        advanced to the descending colon before the procedure                        was aborted. Medications were given. Findings:      Procedure aborted due to inadequate prep.      A moderate amount of stool was found in the sigmoid colon, in the       descending colon and in the transverse colon, making visualization       difficult. Impression:           - No specimens collected. Recommendation:       - The findings and recommendations were discussed with                        the patient. Manya Silvas, MD 05/30/2018 8:06:49 AM This report has been signed electronically. Number of Addenda: 0 Note Initiated On: 05/30/2018 7:34 AM Total Procedure Duration: 0 hours 14 minutes 25 seconds       [facility]

## 2018-05-30 NOTE — Progress Notes (Signed)
Pt. Checked CBG with home monitor, 30 minutes after additional 5u insulin given. Value- 194.

## 2018-05-30 NOTE — Anesthesia Post-op Follow-up Note (Signed)
Anesthesia QCDR form completed.        

## 2018-05-30 NOTE — Brief Op Note (Signed)
Pt to drink additional Miralax while in recovery unit for another procedure this afternoon

## 2018-05-30 NOTE — Op Note (Addendum)
[facility] Gastroenterology Patient Name: Bradley Bradley Procedure Date: 05/30/2018 2:34 PM MRN: [id number] Account #: [id number] Date of Birth: 1966-02-20 Admit Type: Outpatient Age: 52 Room: [facility] ENDO ROOM 3 Gender: Male Note Status: Finalized Procedure:            Colonoscopy Indications:          High risk colon cancer surveillance: Personal history                        of colonic polyps Providers:            Manya Silvas, MD Referring MD:         Dion Body (Referring MD) Medicines:            Propofol per Anesthesia Complications:        No immediate complications. Procedure:            Pre-Anesthesia Assessment:                       - After reviewing the risks and benefits, the patient                        was deemed in satisfactory condition to undergo the                        procedure.                       After obtaining informed consent, the colonoscope was                        passed under direct vision. Throughout the procedure,                        the patient's blood pressure, pulse, and oxygen                        saturations were monitored continuously. The                        Colonoscope was introduced through the anus and                        advanced to the the cecum, identified by appendiceal                        orifice and ileocecal valve. The colonoscopy was                        somewhat difficult due to a redundant colon. Successful                        completion of the procedure was aided by applying                        abdominal pressure. The patient tolerated the procedure                        well. The quality of the bowel preparation was good. Findings:      Four sessile polyps were found in the proximal transverse colon. The  polyps were diminutive in size. These polyps were removed with a jumbo       cold forceps. Resection and retrieval were complete. They were near the      metal clip placed last exam.      The colon was long and redundant with looping up in various places.      A lipoma was seen in ascending colon.      A diminutive polyp was found in the sigmoid colon. The polyp was       sessile. The polyp was removed with a cold snare. Resection and       retrieval were complete. Impression:           - Four diminutive polyps in the proximal transverse                        colon, removed with a jumbo cold forceps. Resected and                        retrieved.                       - One diminutive polyp in the sigmoid colon, removed                        with a cold snare. Resected and retrieved. Recommendation:       - Await pathology results. Follow up in 1 year. Manya Silvas, MD 05/30/2018 3:15:30 PM This report has been signed electronically. Number of Addenda: 0 Note Initiated On: 05/30/2018 2:34 PM Scope Withdrawal Time: 0 hours 12 minutes 36 seconds  Total Procedure Duration: 0 hours 28 minutes 42 seconds       [facility]

## 2018-05-30 NOTE — H&P (Signed)
Primary Care Physician:  Dion Body, MD Primary Gastroenterologist:  Dr. Vira Agar  Pre-Procedure History & Physical: HPI:  Bradley Bradley is a 52 y.o. male is here for an colonoscopy.  Done for [facility] colon polyps.   Past Medical History:  Diagnosis Date  . Diabetes mellitus without complication (HCC)    TYPE ONE  . Hypertension   . Mixed hyperlipidemia   . 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 Pulmonary embolism [facility])     Past Surgical History:  Procedure Laterality Date  . APPENDECTOMY    . COLONOSCOPY WITH PROPOFOL N/A 10/14/2017   Procedure: COLONOSCOPY WITH PROPOFOL;  Surgeon: Manya Silvas, MD;  Location: [facility] ENDOSCOPY;  Service: Endoscopy;  Laterality: N/A;  . DENTAL SURGERY    . EYE SURGERY    . history of adenomatous polyp    . JOINT REPLACEMENT      Prior to Admission medications   Medication Sig Start Date End Date Taking? Authorizing Provider  atorvastatin (LIPITOR) 10 MG tablet Take 10 mg by mouth at bedtime.   Yes [provider]  azelastine (ASTELIN) 0.1 % nasal spray Place 2 sprays into both nostrils 2 (two) times daily as needed for rhinitis. Use in each nostril as directed   Yes [provider]  b complex vitamins tablet Take 1 tablet by mouth at bedtime.   Yes [provider]  budesonide (PULMICORT) 0.5 MG/2ML nebulizer solution Take 2 mLs (0.5 mg total) by nebulization 2 (two) times daily. 07/25/15  Yes [name], MD  Cinnamon 500 MG capsule Take 500 mg by mouth every other day.    Yes [provider]  Coenzyme Q10 (CO Q 10 PO) Take 1 tablet by mouth at bedtime.   Yes [provider]  fenofibrate (TRICOR) 145 MG tablet Take 145 mg by mouth at bedtime.   Yes [provider]  Ginger, Zingiber officinalis, 500 MG CAPS Take 1 capsule by mouth every other day.   Yes [provider]  glucosamine-chondroitin 500-400 MG tablet Take 0.5 tablets by mouth  at bedtime.   Yes [provider]  insulin aspart (NOVOLOG) 100 UNIT/ML injection Inject 3 Units into the skin 3 (three) times daily with meals. Patient taking differently: Inject 12 Units into the skin 3 (three) times daily with meals.  07/25/15  Yes [name], MD  insulin glargine (LANTUS) 100 UNIT/ML injection Inject 0.15 mLs (15 Units total) into the skin at bedtime. 07/25/15  Yes [name], MD  insulin NPH Human (HUMULIN N,NOVOLIN N) 100 UNIT/ML injection Inject 12 Units into the skin daily before breakfast.    Yes [provider]  ipratropium-albuterol (DUONEB) 0.5-2.5 (3) MG/3ML SOLN Take 3 mLs by nebulization every 4 (four) hours. 07/25/15  Yes [name], MD  lansoprazole (PREVACID) 15 MG capsule Take 15 mg by mouth daily at 12 noon.   Yes [provider]  lisinopril-hydrochlorothiazide (PRINZIDE,ZESTORETIC) 10-12.5 MG tablet Take by mouth daily.   Yes [provider]  metFORMIN (GLUCOPHAGE) 500 MG tablet Take 1,000 mg by mouth 2 (two) times daily with a meal.    Yes [provider]  metoprolol tartrate (LOPRESSOR) 25 MG tablet Take 12.5 mg by mouth 2 (two) times daily.   Yes [provider]  Multiple Vitamin (MULTIVITAMIN) tablet Take 1 tablet by mouth at bedtime.   Yes [provider]  omeprazole (PRILOSEC) 20 MG capsule Take 20 mg by mouth daily.   Yes [provider]  pravastatin (PRAVACHOL) 40 MG tablet Take 40 mg by mouth at bedtime.   Yes [provider]  vitamin B-12 (CYANOCOBALAMIN) 500 MCG tablet Take 500 mcg by mouth daily.   Yes [provider]  apixaban (ELIQUIS) 5 MG TABS tablet Take 2 tablets (10 mg total) by mouth 2 (two) times daily. 07/25/15   [name], MD  apixaban (ELIQUIS) 5 MG TABS tablet Take 1 tablet (5 mg total) by mouth 2 (two) times daily. 08/01/15   [name], MD  feeding supplement, ENSURE ENLIVE, (ENSURE ENLIVE) LIQD  Take 237 mLs by mouth 2 (two) times daily between meals. 07/25/15   [name], MD  oxyCODONE (OXY IR/ROXICODONE) 5 MG immediate release tablet Take 1 tablet (5 mg total) by mouth every 4 (four) hours as needed for moderate pain. Patient not taking: Reported on 05/30/2018 07/25/15   [name], MD    Allergies as of 04/14/2018 - Review Complete 10/14/2017  Allergen Reaction Noted  . Polyvinyl alcohol Swelling 10/11/2017    Family History  Problem Relation Age of Onset  . Pulmonary embolism Brother     Social History   Socioeconomic History  . Marital status: Married    Spouse name: Not on file  . Number of children: Not on file  . Years of education: Not on file  . Highest education level: Not on file  Occupational History  . Not on file  Social Needs  . Financial resource strain: Not on file  . Food insecurity:    Worry: Not on file    Inability: Not on file  . Transportation needs:    Medical: Not on file    Non-medical: Not on file  Tobacco Use  . Smoking status: Former Smoker    Packs/day: 0.25  . Smokeless tobacco: Never Used  Substance and Sexual Activity  . Alcohol use: Yes    Comment: occasionally  . Drug use: Not on file  . Sexual activity: Not on file  Lifestyle  . Physical activity:    Days per week: Not on file    Minutes per session: Not on file  . Stress: Not on file  Relationships  . Social connections:    Talks on phone: Not on file    Gets together: Not on file    Attends religious service: Not on file    Active member of club or organization: Not on file    Attends meetings of clubs or organizations: Not on file    Relationship status: Not on file  . Intimate partner violence:    Fear of current or ex partner: Not on file    Emotionally abused: Not on file    Physically abused: Not on file    Forced sexual activity: Not on file  Other Topics Concern  . Not on file  Social History Narrative  . Not on file    Review  of Systems: See HPI, otherwise negative ROS  Physical Exam: BP 139/75   Pulse 65   Temp (!) 97 F (36.1 C) (Tympanic)   Resp 16   Ht 5\' 5"  (1.651 m)   Wt 78.5 kg (173 lb)   SpO2 97%   BMI 28.79 kg/m  General:   Alert,  pleasant and cooperative in NAD Head:  Normocephalic and atraumatic. Neck:  Supple; no masses or thyromegaly. Lungs:  Clear throughout to auscultation.    Heart:  Regular rate and rhythm. Abdomen:  Soft, nontender and nondistended. Normal bowel sounds, without  guarding, and without rebound.   Neurologic:  Alert and  oriented x4;  grossly normal neurologically.  Impression/Plan: Bradley Bradley is here for an colonoscopy to be performed for Personal history of colon polyps.  Risks, benefits, limitations, and alternatives regarding  colonoscopy have been reviewed with the patient.  Questions have been answered.  All parties agreeable.   Gaylyn Cheers, MD  05/30/2018, 7:33 AM

## 2018-05-30 NOTE — Transfer of Care (Signed)
Immediate Anesthesia Transfer of Care Note  Patient: Bradley Bradley  Procedure(s) Performed: COLONOSCOPY WITH PROPOFOL (N/A )  Patient Location: PACU  Anesthesia Type:General  Level of Consciousness: sedated  Airway & Oxygen Therapy: Patient Spontanous Breathing and Patient connected to nasal cannula oxygen  Post-op Assessment: Report given to RN and Post -op Vital signs reviewed and stable  Post vital signs: Reviewed and stable  Last Vitals:  Vitals Value Taken Time  BP 102/51 05/30/2018  8:08 AM  Temp    Pulse 65 05/30/2018  8:08 AM  Resp 19 05/30/2018  8:08 AM  SpO2 96 % 05/30/2018  8:08 AM  Vitals shown include unvalidated device data.  Last Pain:  Vitals:   05/30/18 0718  TempSrc: Tympanic  PainSc: 0-No pain         Complications: No apparent anesthesia complications

## 2018-05-30 NOTE — Transfer of Care (Signed)
Immediate Anesthesia Transfer of Care Note  Patient: Bradley Bradley  Procedure(s) Performed: COLONOSCOPY WITH PROPOFOL (N/A )  Patient Location: PACU  Anesthesia Type:General  Level of Consciousness: sedated  Airway & Oxygen Therapy: Patient Spontanous Breathing and Patient connected to nasal cannula oxygen  Post-op Assessment: Report given to RN and Post -op Vital signs reviewed and stable  Post vital signs: Reviewed and stable  Last Vitals:  Vitals Value Taken Time  BP 114/68 05/30/2018  3:13 PM  Temp    Pulse 68 05/30/2018  3:14 PM  Resp 15 05/30/2018  3:14 PM  SpO2 93 % 05/30/2018  3:14 PM  Vitals shown include unvalidated device data.  Last Pain: There were no vitals filed for this visit.       Complications: No apparent anesthesia complications

## 2018-05-30 NOTE — Anesthesia Preprocedure Evaluation (Signed)
Anesthesia Evaluation  Patient identified by MRN, date of birth, ID band Patient awake    Reviewed: Allergy & Precautions, H&P , NPO status , Patient's Chart, lab work & pertinent test results, reviewed documented beta blocker date and time   Airway Mallampati: II   Neck ROM: full    Dental  (+) Poor Dentition   Pulmonary neg pulmonary ROS, former smoker,    Pulmonary exam normal        Cardiovascular Exercise Tolerance: Good hypertension, On Medications negative cardio ROS Normal cardiovascular exam Rhythm:regular Rate:Normal     Neuro/Psych  Neuromuscular disease negative neurological ROS  negative psych ROS   GI/Hepatic negative GI ROS, Neg liver ROS,   Endo/Other  negative endocrine ROSdiabetes  Renal/GU negative Renal ROS  negative genitourinary   Musculoskeletal   Abdominal   Peds  Hematology negative hematology ROS (+)   Anesthesia Other Findings Past Medical History: No date: Diabetes mellitus without complication (HCC)     Comment:  TYPE ONE No date: Hypertension No date: Mixed hyperlipidemia No date: 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[place]) No date: 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[place]) No date: Pulmonary embolism ([place]) Past Surgical History: No date: APPENDECTOMY 10/14/2017: COLONOSCOPY WITH PROPOFOL; N/A     Comment:  Procedure: COLONOSCOPY WITH PROPOFOL;  Surgeon: Manya Silvas, MD;  Location: [facility] ENDOSCOPY;  Service:               Endoscopy;  Laterality: N/A; No date: DENTAL SURGERY No date: EYE SURGERY No date: history of adenomatous polyp No date: JOINT REPLACEMENT   Reproductive/Obstetrics negative OB ROS                             Anesthesia Physical Anesthesia Plan  ASA: III  Anesthesia Plan: General   Post-op Pain Management:    Induction:   PONV Risk Score and Plan:   Airway Management Planned:   Additional Equipment:   Intra-op Plan:    Post-operative Plan:   Informed Consent: I have reviewed the patients History and Physical, chart, labs and discussed the procedure including the risks, benefits and alternatives for the proposed anesthesia with the patient or authorized representative who has indicated his/her understanding and acceptance.   Dental Advisory Given  Plan Discussed with: CRNA  Anesthesia Plan Comments:         Anesthesia Quick Evaluation

## 2018-06-03 LAB — SURGICAL PATHOLOGY

## 2018-06-04 ENCOUNTER — Encounter: Payer: Self-pay | Admitting: Unknown Physician Specialty

## 2018-06-05 NOTE — Anesthesia Postprocedure Evaluation (Signed)
Anesthesia Post Note  Patient: Bradley Bradley  Procedure(s) Performed: COLONOSCOPY WITH PROPOFOL (N/A )  Patient location during evaluation: PACU Anesthesia Type: General Level of consciousness: awake and alert Pain management: pain level controlled Vital Signs Assessment: post-procedure vital signs reviewed and stable Respiratory status: spontaneous breathing, nonlabored ventilation, respiratory function stable and patient connected to nasal cannula oxygen Cardiovascular status: blood pressure returned to baseline and stable Postop Assessment: no apparent nausea or vomiting Anesthetic complications: no     Last Vitals:  Vitals:   05/30/18 1513  Temp: (!) 36.1 C    Last Pain:  Vitals:   05/31/18 1239  TempSrc:   PainSc: 0-No pain                 Molli Barrows

## 2018-06-05 NOTE — Anesthesia Postprocedure Evaluation (Signed)
Anesthesia Post Note  Patient: Bradley Bradley  Procedure(s) Performed: COLONOSCOPY WITH PROPOFOL (N/A )  Patient location during evaluation: PACU Anesthesia Type: General Level of consciousness: awake and alert Pain management: pain level controlled Vital Signs Assessment: post-procedure vital signs reviewed and stable Respiratory status: spontaneous breathing, nonlabored ventilation, respiratory function stable and patient connected to nasal cannula oxygen Cardiovascular status: blood pressure returned to baseline and stable Postop Assessment: no apparent nausea or vomiting Anesthetic complications: no     Last Vitals:  Vitals:   05/30/18 0828 05/30/18 0838  BP: (!) 114/59 111/67  Pulse: (!) 54 (!) 56  Resp: 12 16  Temp:    SpO2: 98% 96%    Last Pain:  Vitals:   05/30/18 0838  TempSrc:   PainSc: 0-No pain                 Molli Barrows

## 2018-06-05 NOTE — Anesthesia Preprocedure Evaluation (Signed)
Anesthesia Evaluation  Patient identified by MRN, date of birth, ID band Patient awake    Reviewed: Allergy & Precautions, H&P , NPO status , Patient's Chart, lab work & pertinent test results, reviewed documented beta blocker date and time   Airway Mallampati: II   Neck ROM: full    Dental  (+) Poor Dentition   Pulmonary neg pulmonary ROS, former smoker,    Pulmonary exam normal        Cardiovascular hypertension, negative cardio ROS Normal cardiovascular exam Rhythm:regular Rate:Normal     Neuro/Psych negative neurological ROS  negative psych ROS   GI/Hepatic negative GI ROS, Neg liver ROS,   Endo/Other  negative endocrine ROSdiabetes  Renal/GU negative Renal ROS  negative genitourinary   Musculoskeletal   Abdominal   Peds  Hematology negative hematology ROS (+)   Anesthesia Other Findings Past Medical History: No date: Diabetes mellitus without complication (HCC)     Comment:  TYPE ONE No date: Hypertension No date: Mixed hyperlipidemia No date: 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[place]) No date: 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[place]) No date: Pulmonary embolism ([place]) Past Surgical History: No date: APPENDECTOMY 10/14/2017: COLONOSCOPY WITH PROPOFOL; N/A     Comment:  Procedure: COLONOSCOPY WITH PROPOFOL;  Surgeon: Manya Silvas, MD;  Location: [facility] ENDOSCOPY;  Service:               Endoscopy;  Laterality: N/A; 05/30/2018: COLONOSCOPY WITH PROPOFOL; N/A     Comment:  Procedure: COLONOSCOPY WITH PROPOFOL;  Surgeon: Manya Silvas, MD;  Location: [facility] ENDOSCOPY;  Service:               Endoscopy;  Laterality: N/A; 05/30/2018: COLONOSCOPY WITH PROPOFOL; N/A     Comment:  Procedure: COLONOSCOPY WITH PROPOFOL;  Surgeon: Manya Silvas, MD;  Location: [facility] ENDOSCOPY;  Service:               Endoscopy;  Laterality: N/A; No date: DENTAL SURGERY No date: EYE SURGERY No date: history of  adenomatous polyp No date: JOINT REPLACEMENT   Reproductive/Obstetrics negative OB ROS                             Anesthesia Physical Anesthesia Plan  ASA: III  Anesthesia Plan: General   Post-op Pain Management:    Induction:   PONV Risk Score and Plan:   Airway Management Planned:   Additional Equipment:   Intra-op Plan:   Post-operative Plan:   Informed Consent: I have reviewed the patients History and Physical, chart, labs and discussed the procedure including the risks, benefits and alternatives for the proposed anesthesia with the patient or authorized representative who has indicated his/her understanding and acceptance.   Dental Advisory Given  Plan Discussed with: CRNA  Anesthesia Plan Comments:         Anesthesia Quick Evaluation

## 2021-02-02 ENCOUNTER — Emergency Department: Payer: Medicare Other

## 2021-02-02 ENCOUNTER — Emergency Department
Admission: EM | Admit: 2021-02-02 | Discharge: 2021-02-02 | Disposition: A | Discharge status: Home / Self Care | Payer: Medicare Other | Attending: Emergency Medicine | Admitting: Emergency Medicine

## 2021-02-02 ENCOUNTER — Other Ambulatory Visit: Payer: Self-pay

## 2021-02-02 DIAGNOSIS — E109 Type 1 diabetes mellitus without complications: Secondary | ICD-10-CM | POA: Insufficient documentation

## 2021-02-02 DIAGNOSIS — I10 Essential (primary) hypertension: Secondary | ICD-10-CM | POA: Insufficient documentation

## 2021-02-02 DIAGNOSIS — Z87891 Personal history of nicotine dependence: Secondary | ICD-10-CM | POA: Insufficient documentation

## 2021-02-02 DIAGNOSIS — S82839A Other fracture of upper and lower end of unspecified fibula, initial encounter for closed fracture: Secondary | ICD-10-CM

## 2021-02-02 DIAGNOSIS — Z7984 Long term (current) use of oral hypoglycemic drugs: Secondary | ICD-10-CM | POA: Insufficient documentation

## 2021-02-02 DIAGNOSIS — Z79899 Other long term (current) drug therapy: Secondary | ICD-10-CM | POA: Insufficient documentation

## 2021-02-02 DIAGNOSIS — Z7901 Long term (current) use of anticoagulants: Secondary | ICD-10-CM | POA: Diagnosis not present

## 2021-02-02 DIAGNOSIS — S82831A Other fracture of upper and lower end of right fibula, initial encounter for closed fracture: Secondary | ICD-10-CM | POA: Insufficient documentation

## 2021-02-02 DIAGNOSIS — S99911A Unspecified injury of right ankle, initial encounter: Secondary | ICD-10-CM | POA: Diagnosis present

## 2021-02-02 DIAGNOSIS — S0990XA Unspecified injury of head, initial encounter: Secondary | ICD-10-CM | POA: Diagnosis not present

## 2021-02-02 DIAGNOSIS — W01198A Fall on same level from slipping, tripping and stumbling with subsequent striking against other object, initial encounter: Secondary | ICD-10-CM | POA: Diagnosis not present

## 2021-02-02 LAB — CBG MONITORING, ED: Glucose-Capillary: 114 mg/dL — ABNORMAL HIGH (ref 70–99)

## 2021-02-02 NOTE — Discharge Instructions (Signed)
You have a very small fracture to the end of the fibula bone in the ankle.  This can be treated as a sprain.  Keep it compressed with Ace wrap and elevated when you are not walking.  You may use ice to decrease the swelling.  Take over-the-counter ibuprofen or Tylenol for pain.  You may bear weight as tolerated and use a cane or crutches for support.  Return to the ER for new, worsening, or persistent headache or swelling, vomiting, vision changes, strokelike symptoms, or any other new or worsening symptoms that concern you

## 2021-02-02 NOTE — ED Notes (Signed)
Abrasions on head cleaned and covered. Rt ankle wrapped with ace wrap.

## 2021-02-02 NOTE — ED Provider Notes (Signed)
[facility] Emergency Department Provider Note ____________________________________________   Event Date/Time   First MD Initiated Contact with Patient 02/02/21 1207     (approximate)  I have reviewed the triage vital signs and the nursing notes.   HISTORY  Chief Complaint Fall, Head Injury, and Ankle Pain    HPI Bradley Bradley is a 55 y.o. male with PMH as noted below including myotonic dystrophy and PE on Eliquis who presents with a head injury, acute onset after mechanical fall.  The patient states that he was climbing into his wife's car when he fell backwards and hit the back of his head.  He did not lose consciousness.  He has localized pain to the back of the head but no generalized headache.  He has no nausea or vomiting.  He denies any dizziness or weakness.  He also reports pain to the right ankle but was able to bear weight on it.  He denies any pain to the neck or back.  Past Medical History:  Diagnosis Date  . Diabetes mellitus without complication (HCC)    TYPE ONE  . Hypertension   . Mixed hyperlipidemia   . 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[place])   . 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[place])   . Pulmonary embolism [facility])     Patient Active Problem List   Diagnosis Date Noted  . 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 Acute respiratory failure ([place]) 07/21/2015  . Bilateral pulmonary embolism ([place]) 07/17/2015    Past Surgical History:  Procedure Laterality Date  . APPENDECTOMY    . COLONOSCOPY WITH PROPOFOL N/A 10/14/2017   Procedure: COLONOSCOPY WITH PROPOFOL;  Surgeon: Manya Silvas, MD;  Location: [facility] ENDOSCOPY;  Service: Endoscopy;  Laterality: N/A;  . COLONOSCOPY WITH PROPOFOL N/A 05/30/2018   Procedure: COLONOSCOPY WITH PROPOFOL;  Surgeon: Manya Silvas, MD;  Location: [facility] ENDOSCOPY;  Service: Endoscopy;  Laterality: N/A;  . COLONOSCOPY WITH PROPOFOL N/A 05/30/2018   Procedure: COLONOSCOPY WITH PROPOFOL;  Surgeon: Manya Silvas,  MD;  Location: [facility] ENDOSCOPY;  Service: Endoscopy;  Laterality: N/A;  . DENTAL SURGERY    . EYE SURGERY    . history of adenomatous polyp    . JOINT REPLACEMENT      Prior to Admission medications   Medication Sig Start Date End Date Taking? Authorizing Provider  apixaban (ELIQUIS) 5 MG TABS tablet Take 2 tablets (10 mg total) by mouth 2 (two) times daily. 07/25/15   [name], MD  apixaban (ELIQUIS) 5 MG TABS tablet Take 1 tablet (5 mg total) by mouth 2 (two) times daily. 08/01/15   [name], MD  atorvastatin (LIPITOR) 10 MG tablet Take 10 mg by mouth at bedtime.    [provider]  azelastine (ASTELIN) 0.1 % nasal spray Place 2 sprays into both nostrils 2 (two) times daily as needed for rhinitis. Use in each nostril as directed    [provider]  b complex vitamins tablet Take 1 tablet by mouth at bedtime.    [provider]  budesonide (PULMICORT) 0.5 MG/2ML nebulizer solution Take 2 mLs (0.5 mg total) by nebulization 2 (two) times daily. 07/25/15   [name], MD  Cinnamon 500 MG capsule Take 500 mg by mouth every other day.     [provider]  Coenzyme Q10 (CO Q 10 PO) Take 1 tablet by mouth at bedtime.    [provider]  feeding supplement, ENSURE ENLIVE, (ENSURE ENLIVE) LIQD Take 237 mLs by mouth 2 (two)  times daily between meals. 07/25/15   [name], MD  fenofibrate (TRICOR) 145 MG tablet Take 145 mg by mouth at bedtime.    [provider]  Ginger, Zingiber officinalis, 500 MG CAPS Take 1 capsule by mouth every other day.    [provider]  glucosamine-chondroitin 500-400 MG tablet Take 0.5 tablets by mouth at bedtime.    [provider]  insulin aspart (NOVOLOG) 100 UNIT/ML injection Inject 3 Units into the skin 3 (three) times daily with meals. Patient taking differently: Inject 12 Units into the skin 3 (three) times daily with meals.  07/25/15   [name], MD  insulin glargine (LANTUS) 100 UNIT/ML injection Inject 0.15 mLs (15 Units total) into the skin at bedtime. 07/25/15   [name], MD  insulin NPH Human (HUMULIN N,NOVOLIN N) 100 UNIT/ML injection Inject 12 Units into the skin daily before breakfast.     [provider]  ipratropium-albuterol (DUONEB) 0.5-2.5 (3) MG/3ML SOLN Take 3 mLs by nebulization every 4 (four) hours. 07/25/15   [name], MD  lansoprazole (PREVACID) 15 MG capsule Take 15 mg by mouth daily at 12 noon.    [provider]  lisinopril-hydrochlorothiazide (PRINZIDE,ZESTORETIC) 10-12.5 MG tablet Take by mouth daily.    [provider]  metFORMIN (GLUCOPHAGE) 500 MG tablet Take 1,000 mg by mouth 2 (two) times daily with a meal.     [provider]  metoprolol tartrate (LOPRESSOR) 25 MG tablet Take 12.5 mg by mouth 2 (two) times daily.    [provider]  Multiple Vitamin (MULTIVITAMIN) tablet Take 1 tablet by mouth at bedtime.    [provider]  omeprazole (PRILOSEC) 20 MG capsule Take 20 mg by mouth daily.    [provider]  oxyCODONE (OXY IR/ROXICODONE) 5 MG immediate release tablet Take 1 tablet (5 mg total) by mouth every 4 (four) hours as needed for moderate pain. Patient not taking: Reported on 05/30/2018 07/25/15   [name], MD  pravastatin (PRAVACHOL) 40 MG tablet Take 40 mg by mouth at bedtime.    [provider]  vitamin B-12 (CYANOCOBALAMIN) 500 MCG tablet Take 500 mcg by mouth daily.    [provider]    Allergies Polyvinyl alcohol  Family History  Problem Relation Age of Onset  . Pulmonary embolism Brother     Social History Social History   Tobacco Use  . Smoking status: Former Smoker    Packs/day: 0.25  . Smokeless tobacco: Never Used  Substance Use Topics  . Alcohol use: Yes    Comment: occasionally    Review of Systems  Constitutional: No fever/chills Eyes: No  visual changes. ENT: No neck pain. Cardiovascular: Denies chest pain. Respiratory: Denies shortness of breath. Gastrointestinal: No vomiting. Genitourinary: Negative for flank pain.   Musculoskeletal: Negative for back pain. Skin: Negative for rash. Neurological: Negative for focal weakness or numbness.   ____________________________________________   PHYSICAL EXAM:  VITAL SIGNS: ED Triage Vitals  Enc Vitals Group     BP 02/02/21 1202 138/74     Pulse Rate 02/02/21 1202 75     Resp 02/02/21 1202 20     Temp 02/02/21 1202 98.2 F (36.8 C)     Temp Source 02/02/21 1202 Oral     SpO2 02/02/21 1202 96 %     Weight 02/02/21 1157 160 lb (72.6 kg)     Height 02/02/21 1157 5\' 5"  (1.651 m)     Head Circumference --  Peak Flow --      Pain Score 02/02/21 1157 8     Pain Loc --      Pain Edu? --      Excl. in [place]? --     Constitutional: Alert and oriented. Well appearing and in no acute distress. Eyes: Conjunctivae are normal.  EOMI.  PERRLA. Head: Approximately 6 cm hematoma to the left parieto-occipital region with superficial abrasion. Nose: No congestion/rhinnorhea. Mouth/Throat: Mucous membranes are moist.   Neck: No no midline cervical spinal tenderness.  Normal range of motion.  Cardiovascular: Good peripheral circulation. Respiratory: Normal respiratory effort.  No retractions.  Gastrointestinal: No distention.  Musculoskeletal: No lower extremity edema.  Extremities warm and well perfused.  Mild tenderness to the right ankle with no deformity.  2+ DP pulse.  No midline spinal tenderness. Neurologic:  Normal speech and language.  Motor intact in all extremities.  Normal coordination Skin:  Skin is warm and dry. No rash noted. Psychiatric: Mood and affect are normal. Speech and behavior are normal.  ____________________________________________   LABS (all labs ordered are listed, but only abnormal results are displayed)  Labs Reviewed  CBG MONITORING, ED -  Abnormal; Notable for the following components:      Result Value   Glucose-Capillary 114 (*)    All other components within normal limits   ____________________________________________  EKG   ____________________________________________  RADIOLOGY  CT head: No ICH or fracture XR R ankle: Talofibular avulsion fracture  ____________________________________________   PROCEDURES  Procedure(s) performed: No  Procedures  Critical Care performed: No ____________________________________________   INITIAL IMPRESSION / ASSESSMENT AND PLAN / ED COURSE  Pertinent labs & imaging results that were available during my care of the patient were reviewed by me and considered in my medical decision making (see chart for details).  55 year old male with PMH as noted above including myotonic dystrophy and PE on Eliquis presents with a head injury as well as right ankle pain after a mechanical fall.  He did not lose consciousness.  On exam he is overall comfortable appearing.  His vital signs are normal.  There is a hematoma with overlying superficial abrasion to the left back of the head and mild tenderness to the right ankle.  Physical exam is otherwise unremarkable.  Neurologic exam is nonfocal.  Overall presentation is consistent with minor head injury.  Given his increased risk being on Eliquis we will obtain a CT head to rule out ICH.  I will also obtain x-rays of the right ankle although I suspect most likely sprain.  ----------------------------------------- 1:53 PM on 02/02/2021 -----------------------------------------  X-rays of the ankle show a talofibular avulsion fracture.  I discussed the case with Dr. Rudene Christians from orthopedics who recommends an Ace wrap and weightbearing as tolerated.  The patient can follow-up with him in 1 week.  CT head shows no acute intracranial abnormality.  At this time the patient is stable for discharge home.  I counseled him on the results of the  work-up and the follow-up plan.  Return precautions given, and he expresses understanding.   ____________________________________________   FINAL CLINICAL IMPRESSION(S) / ED DIAGNOSES  Final diagnoses:  Avulsion fracture of distal fibula  Injury of head, initial encounter      NEW MEDICATIONS STARTED DURING THIS VISIT:  New Prescriptions   No medications on file     Note:  This document was prepared using Dragon voice recognition software and may include unintentional dictation errors.    Arta Silence, MD 02/02/21  1354  

## 2021-02-02 NOTE — ED Triage Notes (Signed)
Pt states he tripped and fell getting into his car fall back and hitting his head, large hematoma noted. Denies LOC, does take eliquis. Pt also c/o right ankle pain. Denies any other pain or injury

## 2022-09-28 ENCOUNTER — Other Ambulatory Visit: Payer: Self-pay | Admitting: Neurology

## 2022-09-28 DIAGNOSIS — R633 Feeding difficulties, unspecified: Secondary | ICD-10-CM

## 2022-09-28 DIAGNOSIS — R1319 Other dysphagia: Secondary | ICD-10-CM

## 2022-09-28 DIAGNOSIS — G7111 Myotonic muscular dystrophy: Secondary | ICD-10-CM

## 2022-10-18 ENCOUNTER — Ambulatory Visit
Admission: RE | Admit: 2022-10-18 | Discharge: 2022-10-18 | Disposition: A | Discharge status: Home / Self Care | Payer: Medicare Other | Source: Ambulatory Visit | Attending: Neurology | Admitting: Neurology

## 2022-10-18 DIAGNOSIS — R633 Feeding difficulties, unspecified: Secondary | ICD-10-CM | POA: Diagnosis present

## 2022-10-18 DIAGNOSIS — R1319 Other dysphagia: Secondary | ICD-10-CM | POA: Diagnosis present

## 2022-10-18 DIAGNOSIS — G7111 Myotonic muscular dystrophy: Secondary | ICD-10-CM | POA: Insufficient documentation

## 2022-10-18 NOTE — Therapy (Signed)
[facility] [place], [place], [address] Phone: [phone]    Modified Barium Swallow  Patient Details  Name: Bradley Bradley MRN: [id number] Date of Birth: 1966/10/24 No data recorded  Encounter Date: 10/18/2022   End of Session - 10/18/22 1333     Visit Number 1    Number of Visits 1    Date for SLP Re-Evaluation 10/18/22    SLP Start Time 1245    SLP Stop Time  1345    SLP Time Calculation (min) 60 min    Activity Tolerance Patient tolerated treatment well             Past Medical History:  Diagnosis Date   Diabetes mellitus without complication ([place])    TYPE ONE   Hypertension    Mixed hyperlipidemia    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[place])    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[place])    Pulmonary embolism ([place])     Past Surgical History:  Procedure Laterality Date   APPENDECTOMY     COLONOSCOPY WITH PROPOFOL N/A 10/14/2017   Procedure: COLONOSCOPY WITH PROPOFOL;  Surgeon: Manya Silvas, MD;  Location: [facility] ENDOSCOPY;  Service: Endoscopy;  Laterality: N/A;   COLONOSCOPY WITH PROPOFOL N/A 05/30/2018   Procedure: COLONOSCOPY WITH PROPOFOL;  Surgeon: Manya Silvas, MD;  Location: [facility] ENDOSCOPY;  Service: Endoscopy;  Laterality: N/A;   COLONOSCOPY WITH PROPOFOL N/A 05/30/2018   Procedure: COLONOSCOPY WITH PROPOFOL;  Surgeon: Manya Silvas, MD;  Location: [facility] ENDOSCOPY;  Service: Endoscopy;  Laterality: N/A;   DENTAL SURGERY     EYE SURGERY     history of adenomatous polyp     JOINT REPLACEMENT      Subjective: Patient behavior: Pt alert and agreeable to evaluation.  Chief complaint: Pt/spouse endorsed main concern for swallowing/choking on solids   Objective:  Radiological Procedure: A videoflouroscopic evaluation of oral-preparatory, reflex initiation, and pharyngeal phases of the swallow was performed; as well as a screening of the upper esophageal phase.  POSTURE: upright, sitting in MBSS  chair VIEW: lateral COMPENSATORY STRATEGIES: n/a BOLUSES ADMINISTERED:  Thin Liquid: via cup  Nectar-thick Liquid: via cup  Honey-thick Liquid: not tested   Puree: via spoon   Mechanical Soft: self administered (cracker)  RESULTS OF EVALUATION: ORAL PREPARATORY PHASE: MILD: increased time for mastication and piecemeal deglutition, leading to min oral residue   SWALLOW INITIATION/REFLEX: swallow most consistently triggered at the level of the pyriform sinus vs vallecula with liquids and solids.   PHARYNGEAL PHASE: MILD: reduced laryngeal excursion, pharyngeal contraction, and UES opening leading to residue at the level of the UES for more viscous trials.   LARYNGEAL PENETRATION: penetration noted x1 with sequential, large sips of thin liquids.  ASPIRATION: not witnessed  ESOPHAGEAL PHASE: (Screening of the upper esophagus): no overt esophageal findings  ASSESSMENT: Pt presents with grossly mild oropharyngeal swallow function. Pharyngeal phase notable for reduced laryngeal excursion, pharyngeal constriction, and UES opening leading to moderate residue (solids>liquids) most prominent at the level of the pyriform sinus/UES. Reduced laryngeal closure noted when tested with sequential trials of thin liquids, resultant in penetration that remained in the laryngeal vestibule at the completion of the swallow. No other instance of penetration or aspiration. Swallow initiation triggered at the pyriform sinus (primarily with advanced solids and thin liquids) and vallecula (for puree and nectar thick). Oral phase c/b slowed mastication and piecemeal deglutition, with pt independently clearing residue with subsequent swallows.  Education provided regarding current oropharyngeal swallow ability,  diet recommendations, compensatory strategies (hard swallow), aspiration precautions, and monitoring respiratory status. Given nature of pt's condition, recommend repeat instrumental assessment of swallow in the  setting of increased challenge with eating/drinking or respiratory concerns. Pt/spouse endorsed understanding regarding risk for aspiration and precautions. Pt/spouse report confidence with use of compensatory strategies and carryover from previous SLP intervention; therefore, no OP SLP follow up recommended at this time.    PLAN/RECOMMENDATIONS:  A. Diet: chopped solids and thin liquids (consistent with current baseline diet)   B. Swallowing Precautions: slow rate, small bites, elevated HOB, maintained alertness for intake, hard swallow  C. Recommended consultation: n/a  D. Therapy recommendations- no follow up speech therapy services at this time   E. 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

## 2023-02-06 DIAGNOSIS — E1169 Type 2 diabetes mellitus with other specified complication: Secondary | ICD-10-CM | POA: Diagnosis not present

## 2023-02-06 DIAGNOSIS — Z862 Personal history of diseases of the blood and blood-forming organs and certain disorders involving the immune mechanism: Secondary | ICD-10-CM | POA: Diagnosis not present

## 2023-02-06 DIAGNOSIS — E785 Hyperlipidemia, unspecified: Secondary | ICD-10-CM | POA: Diagnosis not present

## 2023-02-06 DIAGNOSIS — E213 Hyperparathyroidism, unspecified: Secondary | ICD-10-CM | POA: Diagnosis not present

## 2023-02-06 DIAGNOSIS — Z125 Encounter for screening for malignant neoplasm of prostate: Secondary | ICD-10-CM | POA: Diagnosis not present

## 2023-02-06 DIAGNOSIS — E113553 Type 2 diabetes mellitus with stable proliferative diabetic retinopathy, bilateral: Secondary | ICD-10-CM | POA: Diagnosis not present

## 2023-02-07 DIAGNOSIS — E1169 Type 2 diabetes mellitus with other specified complication: Secondary | ICD-10-CM | POA: Diagnosis not present

## 2023-02-07 DIAGNOSIS — Z862 Personal history of diseases of the blood and blood-forming organs and certain disorders involving the immune mechanism: Secondary | ICD-10-CM | POA: Diagnosis not present

## 2023-02-07 DIAGNOSIS — I10 Essential (primary) hypertension: Secondary | ICD-10-CM | POA: Diagnosis not present

## 2023-02-07 DIAGNOSIS — E785 Hyperlipidemia, unspecified: Secondary | ICD-10-CM | POA: Diagnosis not present

## 2023-02-07 DIAGNOSIS — Z86711 Personal history of pulmonary embolism: Secondary | ICD-10-CM | POA: Diagnosis not present

## 2023-02-08 DIAGNOSIS — E113553 Type 2 diabetes mellitus with stable proliferative diabetic retinopathy, bilateral: Secondary | ICD-10-CM | POA: Diagnosis not present

## 2023-02-19 DIAGNOSIS — E113553 Type 2 diabetes mellitus with stable proliferative diabetic retinopathy, bilateral: Secondary | ICD-10-CM | POA: Diagnosis not present

## 2023-02-19 DIAGNOSIS — H16213 Exposure keratoconjunctivitis, bilateral: Secondary | ICD-10-CM | POA: Diagnosis not present

## 2023-02-19 DIAGNOSIS — H02423 Myogenic ptosis of bilateral eyelids: Secondary | ICD-10-CM | POA: Diagnosis not present

## 2023-02-19 DIAGNOSIS — H16233 Neurotrophic keratoconjunctivitis, bilateral: Secondary | ICD-10-CM | POA: Diagnosis not present

## 2023-02-19 DIAGNOSIS — H524 Presbyopia: Secondary | ICD-10-CM | POA: Diagnosis not present

## 2023-03-07 DIAGNOSIS — H16233 Neurotrophic keratoconjunctivitis, bilateral: Secondary | ICD-10-CM | POA: Diagnosis not present

## 2023-03-07 DIAGNOSIS — H02423 Myogenic ptosis of bilateral eyelids: Secondary | ICD-10-CM | POA: Diagnosis not present

## 2023-03-07 DIAGNOSIS — H18892 Other specified disorders of cornea, left eye: Secondary | ICD-10-CM | POA: Diagnosis not present

## 2023-03-07 DIAGNOSIS — H16213 Exposure keratoconjunctivitis, bilateral: Secondary | ICD-10-CM | POA: Diagnosis not present

## 2023-03-09 DIAGNOSIS — E113553 Type 2 diabetes mellitus with stable proliferative diabetic retinopathy, bilateral: Secondary | ICD-10-CM | POA: Diagnosis not present

## 2023-03-12 DIAGNOSIS — H16213 Exposure keratoconjunctivitis, bilateral: Secondary | ICD-10-CM | POA: Diagnosis not present

## 2023-03-12 DIAGNOSIS — H16233 Neurotrophic keratoconjunctivitis, bilateral: Secondary | ICD-10-CM | POA: Diagnosis not present

## 2023-03-12 DIAGNOSIS — H18892 Other specified disorders of cornea, left eye: Secondary | ICD-10-CM | POA: Diagnosis not present

## 2023-03-12 DIAGNOSIS — H02423 Myogenic ptosis of bilateral eyelids: Secondary | ICD-10-CM | POA: Diagnosis not present

## 2023-04-03 DIAGNOSIS — H16233 Neurotrophic keratoconjunctivitis, bilateral: Secondary | ICD-10-CM | POA: Diagnosis not present

## 2023-04-03 DIAGNOSIS — H02423 Myogenic ptosis of bilateral eyelids: Secondary | ICD-10-CM | POA: Diagnosis not present

## 2023-04-03 DIAGNOSIS — H18891 Other specified disorders of cornea, right eye: Secondary | ICD-10-CM | POA: Diagnosis not present

## 2023-04-03 DIAGNOSIS — H16213 Exposure keratoconjunctivitis, bilateral: Secondary | ICD-10-CM | POA: Diagnosis not present

## 2023-04-09 DIAGNOSIS — E113553 Type 2 diabetes mellitus with stable proliferative diabetic retinopathy, bilateral: Secondary | ICD-10-CM | POA: Diagnosis not present

## 2023-05-08 DIAGNOSIS — H16213 Exposure keratoconjunctivitis, bilateral: Secondary | ICD-10-CM | POA: Diagnosis not present

## 2023-05-08 DIAGNOSIS — H524 Presbyopia: Secondary | ICD-10-CM | POA: Diagnosis not present

## 2023-05-08 DIAGNOSIS — H02423 Myogenic ptosis of bilateral eyelids: Secondary | ICD-10-CM | POA: Diagnosis not present

## 2023-05-08 DIAGNOSIS — H16233 Neurotrophic keratoconjunctivitis, bilateral: Secondary | ICD-10-CM | POA: Diagnosis not present

## 2023-05-08 DIAGNOSIS — G7111 Myotonic muscular dystrophy: Secondary | ICD-10-CM | POA: Diagnosis not present

## 2023-05-09 DIAGNOSIS — E113553 Type 2 diabetes mellitus with stable proliferative diabetic retinopathy, bilateral: Secondary | ICD-10-CM | POA: Diagnosis not present

## 2023-05-28 DIAGNOSIS — G8929 Other chronic pain: Secondary | ICD-10-CM | POA: Diagnosis not present

## 2023-05-28 DIAGNOSIS — M545 Low back pain, unspecified: Secondary | ICD-10-CM | POA: Diagnosis not present

## 2023-05-28 DIAGNOSIS — G7111 Myotonic muscular dystrophy: Secondary | ICD-10-CM | POA: Diagnosis not present

## 2023-06-05 DIAGNOSIS — Z86711 Personal history of pulmonary embolism: Secondary | ICD-10-CM | POA: Diagnosis not present

## 2023-06-05 DIAGNOSIS — E113553 Type 2 diabetes mellitus with stable proliferative diabetic retinopathy, bilateral: Secondary | ICD-10-CM | POA: Diagnosis not present

## 2023-06-05 DIAGNOSIS — E1159 Type 2 diabetes mellitus with other circulatory complications: Secondary | ICD-10-CM | POA: Diagnosis not present

## 2023-06-05 DIAGNOSIS — Z794 Long term (current) use of insulin: Secondary | ICD-10-CM | POA: Diagnosis not present

## 2023-06-05 DIAGNOSIS — I152 Hypertension secondary to endocrine disorders: Secondary | ICD-10-CM | POA: Diagnosis not present

## 2023-06-05 DIAGNOSIS — E1169 Type 2 diabetes mellitus with other specified complication: Secondary | ICD-10-CM | POA: Diagnosis not present

## 2023-06-05 DIAGNOSIS — E785 Hyperlipidemia, unspecified: Secondary | ICD-10-CM | POA: Diagnosis not present

## 2023-06-05 DIAGNOSIS — G7111 Myotonic muscular dystrophy: Secondary | ICD-10-CM | POA: Diagnosis not present

## 2023-08-06 DIAGNOSIS — I152 Hypertension secondary to endocrine disorders: Secondary | ICD-10-CM | POA: Diagnosis not present

## 2023-08-06 DIAGNOSIS — E785 Hyperlipidemia, unspecified: Secondary | ICD-10-CM | POA: Diagnosis not present

## 2023-08-06 DIAGNOSIS — E1169 Type 2 diabetes mellitus with other specified complication: Secondary | ICD-10-CM | POA: Diagnosis not present

## 2023-08-06 DIAGNOSIS — E1159 Type 2 diabetes mellitus with other circulatory complications: Secondary | ICD-10-CM | POA: Diagnosis not present

## 2023-08-06 DIAGNOSIS — Z862 Personal history of diseases of the blood and blood-forming organs and certain disorders involving the immune mechanism: Secondary | ICD-10-CM | POA: Diagnosis not present

## 2023-08-13 DIAGNOSIS — I10 Essential (primary) hypertension: Secondary | ICD-10-CM | POA: Diagnosis not present

## 2023-08-13 DIAGNOSIS — Z1331 Encounter for screening for depression: Secondary | ICD-10-CM | POA: Diagnosis not present

## 2023-08-13 DIAGNOSIS — Z87891 Personal history of nicotine dependence: Secondary | ICD-10-CM | POA: Diagnosis not present

## 2023-08-13 DIAGNOSIS — D649 Anemia, unspecified: Secondary | ICD-10-CM | POA: Diagnosis not present

## 2023-08-13 DIAGNOSIS — E875 Hyperkalemia: Secondary | ICD-10-CM | POA: Diagnosis not present

## 2023-08-13 DIAGNOSIS — E119 Type 2 diabetes mellitus without complications: Secondary | ICD-10-CM | POA: Diagnosis not present

## 2023-08-13 DIAGNOSIS — Z86711 Personal history of pulmonary embolism: Secondary | ICD-10-CM | POA: Diagnosis not present

## 2023-08-13 DIAGNOSIS — Z0000 Encounter for general adult medical examination without abnormal findings: Secondary | ICD-10-CM | POA: Diagnosis not present

## 2023-08-13 DIAGNOSIS — E785 Hyperlipidemia, unspecified: Secondary | ICD-10-CM | POA: Diagnosis not present

## 2023-08-15 DIAGNOSIS — Z794 Long term (current) use of insulin: Secondary | ICD-10-CM | POA: Diagnosis not present

## 2023-08-15 DIAGNOSIS — I152 Hypertension secondary to endocrine disorders: Secondary | ICD-10-CM | POA: Diagnosis not present

## 2023-08-15 DIAGNOSIS — E1169 Type 2 diabetes mellitus with other specified complication: Secondary | ICD-10-CM | POA: Diagnosis not present

## 2023-08-15 DIAGNOSIS — E785 Hyperlipidemia, unspecified: Secondary | ICD-10-CM | POA: Diagnosis not present

## 2023-08-15 DIAGNOSIS — E113553 Type 2 diabetes mellitus with stable proliferative diabetic retinopathy, bilateral: Secondary | ICD-10-CM | POA: Diagnosis not present

## 2023-08-15 DIAGNOSIS — E1159 Type 2 diabetes mellitus with other circulatory complications: Secondary | ICD-10-CM | POA: Diagnosis not present

## 2023-08-15 DIAGNOSIS — E559 Vitamin D deficiency, unspecified: Secondary | ICD-10-CM | POA: Diagnosis not present

## 2023-09-30 DIAGNOSIS — G8929 Other chronic pain: Secondary | ICD-10-CM | POA: Diagnosis not present

## 2023-09-30 DIAGNOSIS — G7111 Myotonic muscular dystrophy: Secondary | ICD-10-CM | POA: Diagnosis not present

## 2023-09-30 DIAGNOSIS — M545 Low back pain, unspecified: Secondary | ICD-10-CM | POA: Diagnosis not present

## 2023-10-07 DIAGNOSIS — Z20822 Contact with and (suspected) exposure to covid-19: Secondary | ICD-10-CM | POA: Diagnosis not present

## 2023-10-07 DIAGNOSIS — J189 Pneumonia, unspecified organism: Secondary | ICD-10-CM | POA: Diagnosis not present

## 2023-10-31 DIAGNOSIS — H02423 Myogenic ptosis of bilateral eyelids: Secondary | ICD-10-CM | POA: Diagnosis not present

## 2023-10-31 DIAGNOSIS — H16213 Exposure keratoconjunctivitis, bilateral: Secondary | ICD-10-CM | POA: Diagnosis not present

## 2023-10-31 DIAGNOSIS — L718 Other rosacea: Secondary | ICD-10-CM | POA: Diagnosis not present

## 2023-10-31 DIAGNOSIS — H16233 Neurotrophic keratoconjunctivitis, bilateral: Secondary | ICD-10-CM | POA: Diagnosis not present

## 2024-02-19 DIAGNOSIS — E113553 Type 2 diabetes mellitus with stable proliferative diabetic retinopathy, bilateral: Secondary | ICD-10-CM | POA: Diagnosis not present

## 2024-02-19 DIAGNOSIS — E559 Vitamin D deficiency, unspecified: Secondary | ICD-10-CM | POA: Diagnosis not present

## 2024-02-19 DIAGNOSIS — Z794 Long term (current) use of insulin: Secondary | ICD-10-CM | POA: Diagnosis not present

## 2024-02-20 DIAGNOSIS — H16213 Exposure keratoconjunctivitis, bilateral: Secondary | ICD-10-CM | POA: Diagnosis not present

## 2024-02-20 DIAGNOSIS — H02421 Myogenic ptosis of right eyelid: Secondary | ICD-10-CM | POA: Diagnosis not present

## 2024-02-20 DIAGNOSIS — H04123 Dry eye syndrome of bilateral lacrimal glands: Secondary | ICD-10-CM | POA: Diagnosis not present

## 2024-02-20 DIAGNOSIS — H02234 Paralytic lagophthalmos left upper eyelid: Secondary | ICD-10-CM | POA: Diagnosis not present

## 2024-02-20 DIAGNOSIS — H0279 Other degenerative disorders of eyelid and periocular area: Secondary | ICD-10-CM | POA: Diagnosis not present

## 2024-02-20 DIAGNOSIS — H02231 Paralytic lagophthalmos right upper eyelid: Secondary | ICD-10-CM | POA: Diagnosis not present

## 2024-02-20 DIAGNOSIS — H02422 Myogenic ptosis of left eyelid: Secondary | ICD-10-CM | POA: Diagnosis not present

## 2024-02-20 DIAGNOSIS — G7111 Myotonic muscular dystrophy: Secondary | ICD-10-CM | POA: Diagnosis not present

## 2024-02-20 DIAGNOSIS — H02423 Myogenic ptosis of bilateral eyelids: Secondary | ICD-10-CM | POA: Diagnosis not present

## 2024-02-27 DIAGNOSIS — G7111 Myotonic muscular dystrophy: Secondary | ICD-10-CM | POA: Diagnosis not present

## 2024-02-27 DIAGNOSIS — Z862 Personal history of diseases of the blood and blood-forming organs and certain disorders involving the immune mechanism: Secondary | ICD-10-CM | POA: Diagnosis not present

## 2024-02-27 DIAGNOSIS — I152 Hypertension secondary to endocrine disorders: Secondary | ICD-10-CM | POA: Diagnosis not present

## 2024-02-27 DIAGNOSIS — E559 Vitamin D deficiency, unspecified: Secondary | ICD-10-CM | POA: Diagnosis not present

## 2024-02-27 DIAGNOSIS — E538 Deficiency of other specified B group vitamins: Secondary | ICD-10-CM | POA: Diagnosis not present

## 2024-02-27 DIAGNOSIS — M25512 Pain in left shoulder: Secondary | ICD-10-CM | POA: Diagnosis not present

## 2024-02-27 DIAGNOSIS — E1169 Type 2 diabetes mellitus with other specified complication: Secondary | ICD-10-CM | POA: Diagnosis not present

## 2024-02-27 DIAGNOSIS — E785 Hyperlipidemia, unspecified: Secondary | ICD-10-CM | POA: Diagnosis not present

## 2024-02-27 DIAGNOSIS — E113553 Type 2 diabetes mellitus with stable proliferative diabetic retinopathy, bilateral: Secondary | ICD-10-CM | POA: Diagnosis not present

## 2024-02-27 DIAGNOSIS — E1159 Type 2 diabetes mellitus with other circulatory complications: Secondary | ICD-10-CM | POA: Diagnosis not present

## 2024-02-27 DIAGNOSIS — M25511 Pain in right shoulder: Secondary | ICD-10-CM | POA: Diagnosis not present

## 2024-02-27 DIAGNOSIS — Z794 Long term (current) use of insulin: Secondary | ICD-10-CM | POA: Diagnosis not present

## 2024-03-03 DIAGNOSIS — G7111 Myotonic muscular dystrophy: Secondary | ICD-10-CM | POA: Diagnosis not present

## 2024-03-03 DIAGNOSIS — H16233 Neurotrophic keratoconjunctivitis, bilateral: Secondary | ICD-10-CM | POA: Diagnosis not present

## 2024-03-03 DIAGNOSIS — H02423 Myogenic ptosis of bilateral eyelids: Secondary | ICD-10-CM | POA: Diagnosis not present

## 2024-03-03 DIAGNOSIS — L718 Other rosacea: Secondary | ICD-10-CM | POA: Diagnosis not present

## 2024-03-03 DIAGNOSIS — H16213 Exposure keratoconjunctivitis, bilateral: Secondary | ICD-10-CM | POA: Diagnosis not present

## 2024-05-04 DIAGNOSIS — L718 Other rosacea: Secondary | ICD-10-CM | POA: Diagnosis not present

## 2024-05-04 DIAGNOSIS — H02423 Myogenic ptosis of bilateral eyelids: Secondary | ICD-10-CM | POA: Diagnosis not present

## 2024-05-04 DIAGNOSIS — H16233 Neurotrophic keratoconjunctivitis, bilateral: Secondary | ICD-10-CM | POA: Diagnosis not present

## 2024-05-04 DIAGNOSIS — E113553 Type 2 diabetes mellitus with stable proliferative diabetic retinopathy, bilateral: Secondary | ICD-10-CM | POA: Diagnosis not present

## 2024-05-04 DIAGNOSIS — H16213 Exposure keratoconjunctivitis, bilateral: Secondary | ICD-10-CM | POA: Diagnosis not present

## 2024-05-04 DIAGNOSIS — H524 Presbyopia: Secondary | ICD-10-CM | POA: Diagnosis not present

## 2024-05-27 DIAGNOSIS — E113553 Type 2 diabetes mellitus with stable proliferative diabetic retinopathy, bilateral: Secondary | ICD-10-CM | POA: Diagnosis not present

## 2024-05-27 DIAGNOSIS — I152 Hypertension secondary to endocrine disorders: Secondary | ICD-10-CM | POA: Diagnosis not present

## 2024-05-27 DIAGNOSIS — E1169 Type 2 diabetes mellitus with other specified complication: Secondary | ICD-10-CM | POA: Diagnosis not present

## 2024-05-27 DIAGNOSIS — E785 Hyperlipidemia, unspecified: Secondary | ICD-10-CM | POA: Diagnosis not present

## 2024-05-27 DIAGNOSIS — Z794 Long term (current) use of insulin: Secondary | ICD-10-CM | POA: Diagnosis not present

## 2024-05-27 DIAGNOSIS — Z86711 Personal history of pulmonary embolism: Secondary | ICD-10-CM | POA: Diagnosis not present

## 2024-05-27 DIAGNOSIS — E1159 Type 2 diabetes mellitus with other circulatory complications: Secondary | ICD-10-CM | POA: Diagnosis not present

## 2024-05-27 DIAGNOSIS — G7111 Myotonic muscular dystrophy: Secondary | ICD-10-CM | POA: Diagnosis not present

## 2024-08-25 DIAGNOSIS — Z794 Long term (current) use of insulin: Secondary | ICD-10-CM | POA: Diagnosis not present

## 2024-08-25 DIAGNOSIS — E559 Vitamin D deficiency, unspecified: Secondary | ICD-10-CM | POA: Diagnosis not present

## 2024-08-25 DIAGNOSIS — E785 Hyperlipidemia, unspecified: Secondary | ICD-10-CM | POA: Diagnosis not present

## 2024-08-25 DIAGNOSIS — Z862 Personal history of diseases of the blood and blood-forming organs and certain disorders involving the immune mechanism: Secondary | ICD-10-CM | POA: Diagnosis not present

## 2024-08-25 DIAGNOSIS — E1169 Type 2 diabetes mellitus with other specified complication: Secondary | ICD-10-CM | POA: Diagnosis not present

## 2024-08-25 DIAGNOSIS — E538 Deficiency of other specified B group vitamins: Secondary | ICD-10-CM | POA: Diagnosis not present

## 2024-08-25 DIAGNOSIS — E113553 Type 2 diabetes mellitus with stable proliferative diabetic retinopathy, bilateral: Secondary | ICD-10-CM | POA: Diagnosis not present

## 2024-09-01 DIAGNOSIS — E113553 Type 2 diabetes mellitus with stable proliferative diabetic retinopathy, bilateral: Secondary | ICD-10-CM | POA: Diagnosis not present

## 2024-09-01 DIAGNOSIS — E119 Type 2 diabetes mellitus without complications: Secondary | ICD-10-CM | POA: Diagnosis not present

## 2024-09-01 DIAGNOSIS — Z794 Long term (current) use of insulin: Secondary | ICD-10-CM | POA: Diagnosis not present

## 2024-09-01 DIAGNOSIS — E785 Hyperlipidemia, unspecified: Secondary | ICD-10-CM | POA: Diagnosis not present

## 2024-09-01 DIAGNOSIS — E782 Mixed hyperlipidemia: Secondary | ICD-10-CM | POA: Diagnosis not present

## 2024-09-01 DIAGNOSIS — Z862 Personal history of diseases of the blood and blood-forming organs and certain disorders involving the immune mechanism: Secondary | ICD-10-CM | POA: Diagnosis not present

## 2024-09-01 DIAGNOSIS — Z1331 Encounter for screening for depression: Secondary | ICD-10-CM | POA: Diagnosis not present

## 2024-09-01 DIAGNOSIS — Z0000 Encounter for general adult medical examination without abnormal findings: Secondary | ICD-10-CM | POA: Diagnosis not present

## 2024-09-01 DIAGNOSIS — E559 Vitamin D deficiency, unspecified: Secondary | ICD-10-CM | POA: Diagnosis not present

## 2024-09-01 DIAGNOSIS — E1169 Type 2 diabetes mellitus with other specified complication: Secondary | ICD-10-CM | POA: Diagnosis not present

## 2024-09-01 DIAGNOSIS — Z125 Encounter for screening for malignant neoplasm of prostate: Secondary | ICD-10-CM | POA: Diagnosis not present

## 2024-09-01 DIAGNOSIS — E1159 Type 2 diabetes mellitus with other circulatory complications: Secondary | ICD-10-CM | POA: Diagnosis not present

## 2024-09-01 DIAGNOSIS — I152 Hypertension secondary to endocrine disorders: Secondary | ICD-10-CM | POA: Diagnosis not present

## 2024-10-08 DIAGNOSIS — H16213 Exposure keratoconjunctivitis, bilateral: Secondary | ICD-10-CM | POA: Diagnosis not present

## 2024-10-08 DIAGNOSIS — G7111 Myotonic muscular dystrophy: Secondary | ICD-10-CM | POA: Diagnosis not present

## 2024-10-16 DIAGNOSIS — H189 Unspecified disorder of cornea: Secondary | ICD-10-CM | POA: Diagnosis not present

## 2024-10-16 DIAGNOSIS — R32 Unspecified urinary incontinence: Secondary | ICD-10-CM | POA: Diagnosis not present

## 2024-10-16 DIAGNOSIS — Z794 Long term (current) use of insulin: Secondary | ICD-10-CM | POA: Diagnosis not present

## 2024-10-16 DIAGNOSIS — M545 Low back pain, unspecified: Secondary | ICD-10-CM | POA: Diagnosis not present

## 2024-10-16 DIAGNOSIS — Z7984 Long term (current) use of oral hypoglycemic drugs: Secondary | ICD-10-CM | POA: Diagnosis not present

## 2024-10-16 DIAGNOSIS — H9313 Tinnitus, bilateral: Secondary | ICD-10-CM | POA: Diagnosis not present

## 2024-10-16 DIAGNOSIS — Z9089 Acquired absence of other organs: Secondary | ICD-10-CM | POA: Diagnosis not present

## 2024-10-16 DIAGNOSIS — Z87891 Personal history of nicotine dependence: Secondary | ICD-10-CM | POA: Diagnosis not present

## 2024-10-16 DIAGNOSIS — Z86711 Personal history of pulmonary embolism: Secondary | ICD-10-CM | POA: Diagnosis not present

## 2024-10-16 DIAGNOSIS — E119 Type 2 diabetes mellitus without complications: Secondary | ICD-10-CM | POA: Diagnosis not present

## 2024-10-16 DIAGNOSIS — Z8781 Personal history of (healed) traumatic fracture: Secondary | ICD-10-CM | POA: Diagnosis not present

## 2024-10-16 DIAGNOSIS — G7111 Myotonic muscular dystrophy: Secondary | ICD-10-CM | POA: Diagnosis not present

## 2024-10-16 DIAGNOSIS — E782 Mixed hyperlipidemia: Secondary | ICD-10-CM | POA: Diagnosis not present

## 2024-10-16 DIAGNOSIS — Z7901 Long term (current) use of anticoagulants: Secondary | ICD-10-CM | POA: Diagnosis not present

## 2024-10-16 DIAGNOSIS — G8929 Other chronic pain: Secondary | ICD-10-CM | POA: Diagnosis not present

## 2024-10-16 DIAGNOSIS — I10 Essential (primary) hypertension: Secondary | ICD-10-CM | POA: Diagnosis not present

## 2024-10-20 DIAGNOSIS — H189 Unspecified disorder of cornea: Secondary | ICD-10-CM | POA: Diagnosis not present

## 2024-10-20 DIAGNOSIS — E782 Mixed hyperlipidemia: Secondary | ICD-10-CM | POA: Diagnosis not present

## 2024-10-20 DIAGNOSIS — Z86711 Personal history of pulmonary embolism: Secondary | ICD-10-CM | POA: Diagnosis not present

## 2024-10-20 DIAGNOSIS — G8929 Other chronic pain: Secondary | ICD-10-CM | POA: Diagnosis not present

## 2024-10-20 DIAGNOSIS — Z7901 Long term (current) use of anticoagulants: Secondary | ICD-10-CM | POA: Diagnosis not present

## 2024-10-20 DIAGNOSIS — G7111 Myotonic muscular dystrophy: Secondary | ICD-10-CM | POA: Diagnosis not present

## 2024-10-20 DIAGNOSIS — Z87891 Personal history of nicotine dependence: Secondary | ICD-10-CM | POA: Diagnosis not present

## 2024-10-20 DIAGNOSIS — Z9089 Acquired absence of other organs: Secondary | ICD-10-CM | POA: Diagnosis not present

## 2024-10-20 DIAGNOSIS — M545 Low back pain, unspecified: Secondary | ICD-10-CM | POA: Diagnosis not present

## 2024-10-20 DIAGNOSIS — H9313 Tinnitus, bilateral: Secondary | ICD-10-CM | POA: Diagnosis not present

## 2024-10-20 DIAGNOSIS — Z794 Long term (current) use of insulin: Secondary | ICD-10-CM | POA: Diagnosis not present

## 2024-10-20 DIAGNOSIS — I10 Essential (primary) hypertension: Secondary | ICD-10-CM | POA: Diagnosis not present

## 2024-10-20 DIAGNOSIS — Z8781 Personal history of (healed) traumatic fracture: Secondary | ICD-10-CM | POA: Diagnosis not present

## 2024-10-20 DIAGNOSIS — R32 Unspecified urinary incontinence: Secondary | ICD-10-CM | POA: Diagnosis not present

## 2024-10-20 DIAGNOSIS — E119 Type 2 diabetes mellitus without complications: Secondary | ICD-10-CM | POA: Diagnosis not present

## 2024-10-20 DIAGNOSIS — Z7984 Long term (current) use of oral hypoglycemic drugs: Secondary | ICD-10-CM | POA: Diagnosis not present

## 2024-10-23 DIAGNOSIS — R32 Unspecified urinary incontinence: Secondary | ICD-10-CM | POA: Diagnosis not present

## 2024-10-23 DIAGNOSIS — I10 Essential (primary) hypertension: Secondary | ICD-10-CM | POA: Diagnosis not present

## 2024-10-23 DIAGNOSIS — Z7901 Long term (current) use of anticoagulants: Secondary | ICD-10-CM | POA: Diagnosis not present

## 2024-10-23 DIAGNOSIS — M545 Low back pain, unspecified: Secondary | ICD-10-CM | POA: Diagnosis not present

## 2024-10-23 DIAGNOSIS — Z8781 Personal history of (healed) traumatic fracture: Secondary | ICD-10-CM | POA: Diagnosis not present

## 2024-10-23 DIAGNOSIS — H189 Unspecified disorder of cornea: Secondary | ICD-10-CM | POA: Diagnosis not present

## 2024-10-23 DIAGNOSIS — Z87891 Personal history of nicotine dependence: Secondary | ICD-10-CM | POA: Diagnosis not present

## 2024-10-23 DIAGNOSIS — G7111 Myotonic muscular dystrophy: Secondary | ICD-10-CM | POA: Diagnosis not present

## 2024-10-23 DIAGNOSIS — Z794 Long term (current) use of insulin: Secondary | ICD-10-CM | POA: Diagnosis not present

## 2024-10-23 DIAGNOSIS — H9313 Tinnitus, bilateral: Secondary | ICD-10-CM | POA: Diagnosis not present

## 2024-10-23 DIAGNOSIS — E119 Type 2 diabetes mellitus without complications: Secondary | ICD-10-CM | POA: Diagnosis not present

## 2024-10-23 DIAGNOSIS — E782 Mixed hyperlipidemia: Secondary | ICD-10-CM | POA: Diagnosis not present

## 2024-10-23 DIAGNOSIS — Z9089 Acquired absence of other organs: Secondary | ICD-10-CM | POA: Diagnosis not present

## 2024-10-23 DIAGNOSIS — Z7984 Long term (current) use of oral hypoglycemic drugs: Secondary | ICD-10-CM | POA: Diagnosis not present

## 2024-10-23 DIAGNOSIS — G8929 Other chronic pain: Secondary | ICD-10-CM | POA: Diagnosis not present

## 2024-10-23 DIAGNOSIS — Z86711 Personal history of pulmonary embolism: Secondary | ICD-10-CM | POA: Diagnosis not present

## 2024-11-03 DIAGNOSIS — Z9089 Acquired absence of other organs: Secondary | ICD-10-CM | POA: Diagnosis not present

## 2024-11-03 DIAGNOSIS — H189 Unspecified disorder of cornea: Secondary | ICD-10-CM | POA: Diagnosis not present

## 2024-11-03 DIAGNOSIS — H9313 Tinnitus, bilateral: Secondary | ICD-10-CM | POA: Diagnosis not present

## 2024-11-03 DIAGNOSIS — R32 Unspecified urinary incontinence: Secondary | ICD-10-CM | POA: Diagnosis not present

## 2024-11-03 DIAGNOSIS — G7111 Myotonic muscular dystrophy: Secondary | ICD-10-CM | POA: Diagnosis not present

## 2024-11-03 DIAGNOSIS — Z86711 Personal history of pulmonary embolism: Secondary | ICD-10-CM | POA: Diagnosis not present

## 2024-11-03 DIAGNOSIS — Z7901 Long term (current) use of anticoagulants: Secondary | ICD-10-CM | POA: Diagnosis not present

## 2024-11-03 DIAGNOSIS — G8929 Other chronic pain: Secondary | ICD-10-CM | POA: Diagnosis not present

## 2024-11-03 DIAGNOSIS — Z7984 Long term (current) use of oral hypoglycemic drugs: Secondary | ICD-10-CM | POA: Diagnosis not present

## 2024-11-03 DIAGNOSIS — Z87891 Personal history of nicotine dependence: Secondary | ICD-10-CM | POA: Diagnosis not present

## 2024-11-03 DIAGNOSIS — M545 Low back pain, unspecified: Secondary | ICD-10-CM | POA: Diagnosis not present

## 2024-11-03 DIAGNOSIS — E782 Mixed hyperlipidemia: Secondary | ICD-10-CM | POA: Diagnosis not present

## 2024-11-03 DIAGNOSIS — Z8781 Personal history of (healed) traumatic fracture: Secondary | ICD-10-CM | POA: Diagnosis not present

## 2024-11-03 DIAGNOSIS — Z794 Long term (current) use of insulin: Secondary | ICD-10-CM | POA: Diagnosis not present

## 2024-11-03 DIAGNOSIS — E119 Type 2 diabetes mellitus without complications: Secondary | ICD-10-CM | POA: Diagnosis not present

## 2024-11-03 DIAGNOSIS — I10 Essential (primary) hypertension: Secondary | ICD-10-CM | POA: Diagnosis not present

## 2024-11-06 DIAGNOSIS — Z86711 Personal history of pulmonary embolism: Secondary | ICD-10-CM | POA: Diagnosis not present

## 2024-11-06 DIAGNOSIS — E119 Type 2 diabetes mellitus without complications: Secondary | ICD-10-CM | POA: Diagnosis not present

## 2024-11-06 DIAGNOSIS — G7111 Myotonic muscular dystrophy: Secondary | ICD-10-CM | POA: Diagnosis not present

## 2024-11-06 DIAGNOSIS — M545 Low back pain, unspecified: Secondary | ICD-10-CM | POA: Diagnosis not present

## 2024-11-06 DIAGNOSIS — E782 Mixed hyperlipidemia: Secondary | ICD-10-CM | POA: Diagnosis not present

## 2024-11-06 DIAGNOSIS — Z8781 Personal history of (healed) traumatic fracture: Secondary | ICD-10-CM | POA: Diagnosis not present

## 2024-11-06 DIAGNOSIS — H9313 Tinnitus, bilateral: Secondary | ICD-10-CM | POA: Diagnosis not present

## 2024-11-06 DIAGNOSIS — R32 Unspecified urinary incontinence: Secondary | ICD-10-CM | POA: Diagnosis not present

## 2024-11-06 DIAGNOSIS — Z794 Long term (current) use of insulin: Secondary | ICD-10-CM | POA: Diagnosis not present

## 2024-11-06 DIAGNOSIS — I10 Essential (primary) hypertension: Secondary | ICD-10-CM | POA: Diagnosis not present

## 2024-11-06 DIAGNOSIS — H189 Unspecified disorder of cornea: Secondary | ICD-10-CM | POA: Diagnosis not present

## 2024-11-06 DIAGNOSIS — Z9089 Acquired absence of other organs: Secondary | ICD-10-CM | POA: Diagnosis not present

## 2024-11-06 DIAGNOSIS — Z7984 Long term (current) use of oral hypoglycemic drugs: Secondary | ICD-10-CM | POA: Diagnosis not present

## 2024-11-06 DIAGNOSIS — G8929 Other chronic pain: Secondary | ICD-10-CM | POA: Diagnosis not present

## 2024-11-06 DIAGNOSIS — Z87891 Personal history of nicotine dependence: Secondary | ICD-10-CM | POA: Diagnosis not present

## 2024-11-06 DIAGNOSIS — Z7901 Long term (current) use of anticoagulants: Secondary | ICD-10-CM | POA: Diagnosis not present

## 2024-11-10 DIAGNOSIS — H189 Unspecified disorder of cornea: Secondary | ICD-10-CM | POA: Diagnosis not present

## 2024-11-10 DIAGNOSIS — R32 Unspecified urinary incontinence: Secondary | ICD-10-CM | POA: Diagnosis not present

## 2024-11-10 DIAGNOSIS — H9313 Tinnitus, bilateral: Secondary | ICD-10-CM | POA: Diagnosis not present

## 2024-11-10 DIAGNOSIS — Z7901 Long term (current) use of anticoagulants: Secondary | ICD-10-CM | POA: Diagnosis not present

## 2024-11-10 DIAGNOSIS — G7111 Myotonic muscular dystrophy: Secondary | ICD-10-CM | POA: Diagnosis not present

## 2024-11-10 DIAGNOSIS — Z794 Long term (current) use of insulin: Secondary | ICD-10-CM | POA: Diagnosis not present

## 2024-11-10 DIAGNOSIS — G8929 Other chronic pain: Secondary | ICD-10-CM | POA: Diagnosis not present

## 2024-11-10 DIAGNOSIS — Z87891 Personal history of nicotine dependence: Secondary | ICD-10-CM | POA: Diagnosis not present

## 2024-11-10 DIAGNOSIS — M545 Low back pain, unspecified: Secondary | ICD-10-CM | POA: Diagnosis not present

## 2024-11-10 DIAGNOSIS — I10 Essential (primary) hypertension: Secondary | ICD-10-CM | POA: Diagnosis not present

## 2024-11-10 DIAGNOSIS — Z86711 Personal history of pulmonary embolism: Secondary | ICD-10-CM | POA: Diagnosis not present

## 2024-11-10 DIAGNOSIS — Z8781 Personal history of (healed) traumatic fracture: Secondary | ICD-10-CM | POA: Diagnosis not present

## 2024-11-10 DIAGNOSIS — E119 Type 2 diabetes mellitus without complications: Secondary | ICD-10-CM | POA: Diagnosis not present

## 2024-11-10 DIAGNOSIS — E782 Mixed hyperlipidemia: Secondary | ICD-10-CM | POA: Diagnosis not present

## 2024-11-10 DIAGNOSIS — Z7984 Long term (current) use of oral hypoglycemic drugs: Secondary | ICD-10-CM | POA: Diagnosis not present

## 2024-11-10 DIAGNOSIS — Z9089 Acquired absence of other organs: Secondary | ICD-10-CM | POA: Diagnosis not present

## 2024-11-17 DIAGNOSIS — H189 Unspecified disorder of cornea: Secondary | ICD-10-CM | POA: Diagnosis not present

## 2024-11-17 DIAGNOSIS — E782 Mixed hyperlipidemia: Secondary | ICD-10-CM | POA: Diagnosis not present

## 2024-11-17 DIAGNOSIS — Z8781 Personal history of (healed) traumatic fracture: Secondary | ICD-10-CM | POA: Diagnosis not present

## 2024-11-17 DIAGNOSIS — Z7901 Long term (current) use of anticoagulants: Secondary | ICD-10-CM | POA: Diagnosis not present

## 2024-11-17 DIAGNOSIS — I10 Essential (primary) hypertension: Secondary | ICD-10-CM | POA: Diagnosis not present

## 2024-11-17 DIAGNOSIS — E119 Type 2 diabetes mellitus without complications: Secondary | ICD-10-CM | POA: Diagnosis not present

## 2024-11-17 DIAGNOSIS — H9313 Tinnitus, bilateral: Secondary | ICD-10-CM | POA: Diagnosis not present

## 2024-11-17 DIAGNOSIS — R32 Unspecified urinary incontinence: Secondary | ICD-10-CM | POA: Diagnosis not present

## 2024-11-17 DIAGNOSIS — Z86711 Personal history of pulmonary embolism: Secondary | ICD-10-CM | POA: Diagnosis not present

## 2024-11-17 DIAGNOSIS — Z794 Long term (current) use of insulin: Secondary | ICD-10-CM | POA: Diagnosis not present

## 2024-11-17 DIAGNOSIS — G7111 Myotonic muscular dystrophy: Secondary | ICD-10-CM | POA: Diagnosis not present

## 2024-11-17 DIAGNOSIS — G8929 Other chronic pain: Secondary | ICD-10-CM | POA: Diagnosis not present

## 2024-11-17 DIAGNOSIS — Z7984 Long term (current) use of oral hypoglycemic drugs: Secondary | ICD-10-CM | POA: Diagnosis not present

## 2024-11-17 DIAGNOSIS — Z9089 Acquired absence of other organs: Secondary | ICD-10-CM | POA: Diagnosis not present

## 2024-11-17 DIAGNOSIS — Z87891 Personal history of nicotine dependence: Secondary | ICD-10-CM | POA: Diagnosis not present

## 2024-11-17 DIAGNOSIS — M545 Low back pain, unspecified: Secondary | ICD-10-CM | POA: Diagnosis not present

## 2024-11-19 DIAGNOSIS — H189 Unspecified disorder of cornea: Secondary | ICD-10-CM | POA: Diagnosis not present

## 2024-11-19 DIAGNOSIS — E782 Mixed hyperlipidemia: Secondary | ICD-10-CM | POA: Diagnosis not present

## 2024-11-19 DIAGNOSIS — Z86711 Personal history of pulmonary embolism: Secondary | ICD-10-CM | POA: Diagnosis not present

## 2024-11-19 DIAGNOSIS — Z794 Long term (current) use of insulin: Secondary | ICD-10-CM | POA: Diagnosis not present

## 2024-11-19 DIAGNOSIS — G7111 Myotonic muscular dystrophy: Secondary | ICD-10-CM | POA: Diagnosis not present

## 2024-11-19 DIAGNOSIS — R32 Unspecified urinary incontinence: Secondary | ICD-10-CM | POA: Diagnosis not present

## 2024-11-19 DIAGNOSIS — Z7984 Long term (current) use of oral hypoglycemic drugs: Secondary | ICD-10-CM | POA: Diagnosis not present

## 2024-11-19 DIAGNOSIS — G8929 Other chronic pain: Secondary | ICD-10-CM | POA: Diagnosis not present

## 2024-11-19 DIAGNOSIS — Z7901 Long term (current) use of anticoagulants: Secondary | ICD-10-CM | POA: Diagnosis not present

## 2024-11-19 DIAGNOSIS — H9313 Tinnitus, bilateral: Secondary | ICD-10-CM | POA: Diagnosis not present

## 2024-11-19 DIAGNOSIS — I10 Essential (primary) hypertension: Secondary | ICD-10-CM | POA: Diagnosis not present

## 2024-11-19 DIAGNOSIS — Z8781 Personal history of (healed) traumatic fracture: Secondary | ICD-10-CM | POA: Diagnosis not present

## 2024-11-19 DIAGNOSIS — M545 Low back pain, unspecified: Secondary | ICD-10-CM | POA: Diagnosis not present

## 2024-11-19 DIAGNOSIS — Z9089 Acquired absence of other organs: Secondary | ICD-10-CM | POA: Diagnosis not present

## 2024-11-19 DIAGNOSIS — Z87891 Personal history of nicotine dependence: Secondary | ICD-10-CM | POA: Diagnosis not present

## 2024-11-19 DIAGNOSIS — E119 Type 2 diabetes mellitus without complications: Secondary | ICD-10-CM | POA: Diagnosis not present

## 2024-11-23 DIAGNOSIS — M545 Low back pain, unspecified: Secondary | ICD-10-CM | POA: Diagnosis not present

## 2024-11-23 DIAGNOSIS — H9313 Tinnitus, bilateral: Secondary | ICD-10-CM | POA: Diagnosis not present

## 2024-11-23 DIAGNOSIS — I10 Essential (primary) hypertension: Secondary | ICD-10-CM | POA: Diagnosis not present

## 2024-11-23 DIAGNOSIS — E782 Mixed hyperlipidemia: Secondary | ICD-10-CM | POA: Diagnosis not present

## 2024-11-23 DIAGNOSIS — G7111 Myotonic muscular dystrophy: Secondary | ICD-10-CM | POA: Diagnosis not present

## 2024-11-23 DIAGNOSIS — G8929 Other chronic pain: Secondary | ICD-10-CM | POA: Diagnosis not present
